# Patient Record
Sex: Female | Born: 1975
Health system: Southern US, Community
[De-identification: ages and names within clinical notes are randomized; demographics above are authoritative.]

## PROBLEM LIST (undated history)

## (undated) DIAGNOSIS — F419 Anxiety disorder, unspecified: Secondary | ICD-10-CM

## (undated) DIAGNOSIS — C4922 Malignant neoplasm of connective and soft tissue of left lower limb, including hip: Secondary | ICD-10-CM

## (undated) DIAGNOSIS — K219 Gastro-esophageal reflux disease without esophagitis: Secondary | ICD-10-CM

## (undated) DIAGNOSIS — C499 Malignant neoplasm of connective and soft tissue, unspecified: Secondary | ICD-10-CM

## (undated) DIAGNOSIS — T7840XA Allergy, unspecified, initial encounter: Secondary | ICD-10-CM

## (undated) HISTORY — DX: Anxiety disorder, unspecified: F41.9

## (undated) HISTORY — DX: Allergy, unspecified, initial encounter: T78.40XA

## (undated) HISTORY — PX: OTHER SURGICAL HISTORY: SHX169

## (undated) HISTORY — DX: Gastro-esophageal reflux disease without esophagitis: K21.9

---

## 2017-01-06 ENCOUNTER — Encounter: Payer: Self-pay | Admitting: Podiatry

## 2017-01-06 ENCOUNTER — Ambulatory Visit (INDEPENDENT_AMBULATORY_CARE_PROVIDER_SITE_OTHER): Payer: BLUE CROSS/BLUE SHIELD

## 2017-01-06 ENCOUNTER — Ambulatory Visit (INDEPENDENT_AMBULATORY_CARE_PROVIDER_SITE_OTHER): Payer: Self-pay | Admitting: Podiatry

## 2017-01-06 VITALS — BP 131/80 | HR 80

## 2017-01-06 DIAGNOSIS — M722 Plantar fascial fibromatosis: Secondary | ICD-10-CM | POA: Diagnosis not present

## 2017-01-06 MED ORDER — TRIAMCINOLONE ACETONIDE 10 MG/ML IJ SUSP
10.0000 mg | Freq: Once | INTRAMUSCULAR | Status: AC
  Administered 2017-01-06: 10 mg

## 2017-01-06 MED ORDER — PREDNISONE 10 MG PO TABS: ORAL_TABLET | ORAL | 0 refills | Status: DC

## 2017-01-06 NOTE — Progress Notes (Signed)
   Subjective:    Patient ID: Michelle Steele, female    DOB: Nov 07, 1975, 41 y.o.   MRN: [id number]  HPI    Review of Systems  All other systems reviewed and are negative.      Objective:   Physical Exam        Assessment & Plan:

## 2017-01-06 NOTE — Progress Notes (Signed)
Subjective:    Patient ID: Michelle Steele, female   DOB: 41 y.o.   MRN: [id number]   HPI patient states she's had plantar fascial pain for over one year with her right being worse than the left and intensification of pain gradually occurring over that time. Patient states she's had 2 injections orthotics made nothing is been helping her and the pain is becoming debilitating to the point where she cannot be active and she's gained 25 pounds. The right is now getting worse and is    Review of Systems  All other systems reviewed and are negative.       Objective:  Physical Exam  Constitutional: She is oriented to person, place, and time.  Cardiovascular: Intact distal pulses.   Musculoskeletal: Normal range of motion.  Neurological: She is alert and oriented to person, place, and time.  Skin: Skin is warm.  Nursing note and vitals reviewed.  neurovascular status intact muscle strength was adequate range of motion within normal limits with patient found to have intense discomfort plantar fascial left over right with inflammation fluid and pain almost to the point where it makes her cry. Patient has depression of the arch of a reasonable amount and has good range of motion of the first MPJ     Assessment:    Acute plantar fasciitis left over right of long-term duration which so far has failed to respond to conservative care     Plan:     H&P conditions reviewed at great length. At this point I do think the consideration is there for surgical intervention at this point and I discussed this versus aggressive conservative treatment in a try aggressive conservative care first and if symptoms do not settle down and it's can require most likely surgical intervention. I did go ahead and I explained all the different things she's had done I injected the plantar fascia today 3 mg Kenalog 5 mill grams Xylocaine and I applied air fracture walker placed on a 12 a sterile prep DS Dosepak and reappoint in 2  weeks to reevaluate  X-rays indicated large spur formation bilateral with no indications of stress fracture

## 2017-01-06 NOTE — Patient Instructions (Signed)

## 2017-01-14 ENCOUNTER — Telehealth: Payer: Self-pay | Admitting: *Deleted

## 2017-01-14 NOTE — Telephone Encounter (Signed)
"  I was in to see Dr. Paulla Dolly last week and he mentioned I might have to have surgery.  I just want to get an idea about how long it will be for me to get surgery schedule.  How far out is he?"  Dr. Mellody Drown schedule is pretty open.  He does surgery on Tuesdays and his next available is May 22, 29, etc.  "Okay I'm scheduled to see him next week.  My foot is doing better since he gave me the injection but my other foot is killing me so I don't know what to do.  I'll discuss it with him when I come in for my appointment."

## 2017-01-27 ENCOUNTER — Ambulatory Visit (INDEPENDENT_AMBULATORY_CARE_PROVIDER_SITE_OTHER): Payer: BLUE CROSS/BLUE SHIELD | Admitting: Podiatry

## 2017-01-27 ENCOUNTER — Encounter: Payer: Self-pay | Admitting: Podiatry

## 2017-01-27 DIAGNOSIS — M722 Plantar fascial fibromatosis: Secondary | ICD-10-CM | POA: Diagnosis not present

## 2017-01-27 MED ORDER — DICLOFENAC SODIUM 75 MG PO TBEC: 75.0000 mg | DELAYED_RELEASE_TABLET | Freq: Two times a day (BID) | ORAL | 1 refills | Status: DC

## 2017-01-27 MED ORDER — TRIAMCINOLONE ACETONIDE 10 MG/ML IJ SUSP
10.0000 mg | Freq: Once | INTRAMUSCULAR | Status: AC
  Administered 2017-01-27: 10 mg

## 2017-01-30 NOTE — Progress Notes (Signed)
Subjective:    Patient ID: Michelle Steele, female   DOB: 41 y.o.   MRN: [id number]   HPI patient states the left heel is feeling a lot better at this time and immobilization seemed to help but the right heel has been very sore    ROS      Objective:  Physical Exam Neurovascular status intact with improved left heel with exquisite discomfort plantar aspect right heel    Assessment:    Plantar fasciitis improving but right heel is quite sore when palpated     Plan:   Reviewed the possibility that the symptoms may recur and it still ultimately could require surgery but today I injected the right plantar fascia 3 mg Kenalog 5 mill grams Xylocaine and instructed on wearing the boot on the right we discussed possible modifications or change and orthotics that were made last

## 2017-02-17 ENCOUNTER — Ambulatory Visit: Payer: BLUE CROSS/BLUE SHIELD | Admitting: Podiatry

## 2017-02-19 ENCOUNTER — Encounter: Payer: Self-pay | Admitting: Podiatry

## 2017-02-19 ENCOUNTER — Ambulatory Visit (INDEPENDENT_AMBULATORY_CARE_PROVIDER_SITE_OTHER): Payer: BLUE CROSS/BLUE SHIELD | Admitting: Podiatry

## 2017-02-19 DIAGNOSIS — M722 Plantar fascial fibromatosis: Secondary | ICD-10-CM | POA: Diagnosis not present

## 2017-02-19 MED ORDER — TRIAMCINOLONE ACETONIDE 10 MG/ML IJ SUSP
10.0000 mg | Freq: Once | INTRAMUSCULAR | Status: AC
  Administered 2017-02-19: 10 mg

## 2017-02-22 NOTE — Progress Notes (Signed)
Subjective:    Patient ID: Michelle Steele, female   DOB: 41 y.o.   MRN: [id number]   HPI patient presents stating I'm doing better but I'm still getting pain in my heel if I been on it too long left    ROS      Objective:  Physical Exam neurovascular status intact with continued discomfort left plantar fashion     Assessment:   Plantar fasciitis secondary to foot structure with inflammation noted      Plan:  Advised on anti-inflammatories physical therapy and at this time reinjected the plantar fascial left 3 mg Kenalog 5 mill grams Xylocaine with no sequela and advised on reduced activity

## 2017-06-29 ENCOUNTER — Other Ambulatory Visit: Payer: Self-pay | Admitting: Podiatry

## 2020-06-27 ENCOUNTER — Ambulatory Visit: Payer: BLUE CROSS/BLUE SHIELD | Admitting: Orthopaedic Surgery

## 2020-07-04 ENCOUNTER — Ambulatory Visit (INDEPENDENT_AMBULATORY_CARE_PROVIDER_SITE_OTHER): Payer: BC Managed Care – PPO | Admitting: Orthopaedic Surgery

## 2020-07-04 ENCOUNTER — Ambulatory Visit: Payer: Self-pay

## 2020-07-04 ENCOUNTER — Encounter: Payer: Self-pay | Admitting: Orthopaedic Surgery

## 2020-07-04 VITALS — Ht 67.5 in | Wt 197.6 lb

## 2020-07-04 DIAGNOSIS — M79605 Pain in left leg: Secondary | ICD-10-CM | POA: Diagnosis not present

## 2020-07-04 DIAGNOSIS — R2242 Localized swelling, mass and lump, left lower limb: Secondary | ICD-10-CM | POA: Diagnosis not present

## 2020-07-04 NOTE — Progress Notes (Signed)
Office Visit Note   Patient: Michelle Steele           Date of Birth: 01-24-1976           MRN: [id number] Visit Date: 07/04/2020              Requested by: No referring provider defined for this encounter. PCP: Patient, No Pcp Per   Assessment & Plan: Visit Diagnoses:  1. Pain in left leg   2. Mass of left thigh     Plan: Given the large size of this mass of the left thigh, a MRI with contrast is necessary to get a better idea of what the mass looks like and what it involves.  I am concerned given it being such a large size.  She agrees with this treatment plan.  We will see her back after the MRI.  Follow-Up Instructions: Return in about 2 weeks (around 07/18/2020).   Orders:  Orders Placed This Encounter  Procedures  . XR FEMUR MIN 2 VIEWS LEFT   No orders of the defined types were placed in this encounter.     Procedures: No procedures performed   Clinical Data: No additional findings.   Subjective: Chief Complaint  Patient presents with  . Left Leg - Pain  The patient is a very pleasant 44 year old female who comes in for evaluation treatment of the left thigh mass.  This is been slowly growing over a few years now.  She first noticed it around 2019.  She says her left thigh is definitely larger than her right thigh.  It has been causing some pain when her daughter sits on her thigh.  She denies any type of pain that wakes her up at night.  She denies any hip or groin pain or knee pain on the left side.  She denies any numbness and tingling in her leg.  She denies any night pain or any previous injury.  HPI  Review of Systems She currently denies any headache, chest pain, shortness of breath, fever, chills, nausea, vomiting  Objective: Vital Signs: Ht 5' 7.5" (1.715 m)   Wt 197 lb 9.6 oz (89.6 kg)   BMI 30.49 kg/m   Physical Exam She is alert and oriented x3 and in no acute distress Ortho Exam On inspection, the left thigh is at least twice the size of  the right thigh.  There is a firm palpable mass of the soft tissues over a wide area of the anterior thigh.  There is no induration of the soft tissue and there is no redness.  Her hip and knee exam are normal.  She does not walk with a limp. Specialty Comments:  No specialty comments available.  Imaging: XR FEMUR MIN 2 VIEWS LEFT  Result Date: 07/04/2020 2 views of the left femur show normal-appearing cortical bone.  There is the outline of a very large soft tissue mass in the anterior thigh compartment.  This can be seen easily in the soft tissues on plain films surprisingly.    PMFS History: There are no problems to display for this patient.  History reviewed. No pertinent past medical history.  History reviewed. No pertinent family history.  History reviewed. No pertinent surgical history. Social History   Occupational History  . Not on file  Tobacco Use  . Smoking status: Unknown If Ever Smoked  . Smokeless tobacco: Never Used  Substance and Sexual Activity  . Alcohol use: No  . Drug use: No  . Sexual  activity: Not on file

## 2020-07-05 ENCOUNTER — Other Ambulatory Visit: Payer: Self-pay

## 2020-07-05 DIAGNOSIS — Q103 Other congenital malformations of eyelid: Secondary | ICD-10-CM

## 2020-07-05 DIAGNOSIS — R2242 Localized swelling, mass and lump, left lower limb: Secondary | ICD-10-CM

## 2020-07-05 DIAGNOSIS — F78A9 Other genetic related intellectual disability: Secondary | ICD-10-CM

## 2020-07-09 ENCOUNTER — Telehealth: Payer: Self-pay | Admitting: Orthopaedic Surgery

## 2020-07-09 NOTE — Telephone Encounter (Signed)
Pt will need to r/s for after MRI, lvm for pt to call back to r/s

## 2020-07-09 NOTE — Telephone Encounter (Signed)
Patient called advised her MRI is not until 07/30/2020 at 9:10am. Patient wanted Dr Ninfa Linden to be aware. The number to contact patient is [phone].  Patient is scheduled to see Dr Ninfa Linden 07/18/2020. Patient asked if she should reschedule her appointment with Dr Ninfa Linden?

## 2020-07-18 ENCOUNTER — Ambulatory Visit: Payer: BC Managed Care – PPO | Admitting: Orthopaedic Surgery

## 2020-07-24 ENCOUNTER — Encounter: Payer: Self-pay | Admitting: Orthopaedic Surgery

## 2020-07-30 ENCOUNTER — Other Ambulatory Visit: Payer: Self-pay | Admitting: Orthopaedic Surgery

## 2020-07-30 ENCOUNTER — Other Ambulatory Visit: Payer: Self-pay

## 2020-07-30 ENCOUNTER — Ambulatory Visit
Admission: RE | Admit: 2020-07-30 | Discharge: 2020-07-30 | Disposition: A | Discharge status: Home / Self Care | Payer: BC Managed Care – PPO | Source: Ambulatory Visit | Attending: Orthopaedic Surgery | Admitting: Orthopaedic Surgery

## 2020-07-30 DIAGNOSIS — R2242 Localized swelling, mass and lump, left lower limb: Secondary | ICD-10-CM

## 2020-07-30 MED ORDER — GADOBENATE DIMEGLUMINE 529 MG/ML IV SOLN
18.0000 mL | Freq: Once | INTRAVENOUS | Status: AC | PRN
  Administered 2020-07-30: 18 mL via INTRAVENOUS

## 2020-07-31 ENCOUNTER — Other Ambulatory Visit: Payer: Self-pay

## 2020-07-31 ENCOUNTER — Telehealth: Payer: Self-pay

## 2020-07-31 ENCOUNTER — Telehealth: Payer: Self-pay | Admitting: Orthopaedic Surgery

## 2020-07-31 DIAGNOSIS — M79652 Pain in left thigh: Secondary | ICD-10-CM

## 2020-07-31 NOTE — Telephone Encounter (Signed)
Patient called she stated Dr.Blackman just called her to schedule a appointment  for [facility] oncology she stated she would like to get a appointment scheduled today if possible [phone]

## 2020-07-31 NOTE — Telephone Encounter (Signed)
Christine with Radiology called wanting to make Dr.Blackman aware the report was ready in [place].  Christine CB# [phone]

## 2020-07-31 NOTE — Telephone Encounter (Signed)
Patient aware order sent urgently and they will call with appt for her

## 2020-08-07 ENCOUNTER — Telehealth: Payer: Self-pay

## 2020-08-07 NOTE — Telephone Encounter (Signed)
[facility] told patient they haven't received notes from [place], can we refax them? Fax [phone]

## 2020-08-08 ENCOUNTER — Telehealth: Payer: Self-pay | Admitting: Orthopaedic Surgery

## 2020-08-08 ENCOUNTER — Ambulatory Visit: Payer: BC Managed Care – PPO | Admitting: Orthopaedic Surgery

## 2020-08-08 NOTE — Telephone Encounter (Signed)
Received vm from patient. Dr. Ninfa Linden has referred her to another MD appt 12/6. and she needs all imaging to take with her. Please call when ready [phone]

## 2020-08-08 NOTE — Telephone Encounter (Signed)
Left voicemail advising patient Michelle Steele of x-rays was ready for pickup at front desk. Also informed patient that she would have to get a copy of her MRI from the location in which that study was performed.

## 2020-10-28 ENCOUNTER — Encounter: Payer: Self-pay | Admitting: Orthopaedic Surgery

## 2020-11-07 DIAGNOSIS — G8918 Other acute postprocedural pain: Secondary | ICD-10-CM | POA: Diagnosis not present

## 2020-11-07 DIAGNOSIS — K219 Gastro-esophageal reflux disease without esophagitis: Secondary | ICD-10-CM | POA: Diagnosis not present

## 2020-11-07 DIAGNOSIS — C4922 Malignant neoplasm of connective and soft tissue of left lower limb, including hip: Secondary | ICD-10-CM | POA: Diagnosis not present

## 2020-11-11 ENCOUNTER — Encounter: Payer: Self-pay | Admitting: *Deleted

## 2020-11-11 ENCOUNTER — Other Ambulatory Visit: Payer: Self-pay | Admitting: *Deleted

## 2020-11-11 DIAGNOSIS — K219 Gastro-esophageal reflux disease without esophagitis: Secondary | ICD-10-CM | POA: Insufficient documentation

## 2020-11-11 DIAGNOSIS — F411 Generalized anxiety disorder: Secondary | ICD-10-CM | POA: Insufficient documentation

## 2020-11-11 DIAGNOSIS — C4922 Malignant neoplasm of connective and soft tissue of left lower limb, including hip: Secondary | ICD-10-CM

## 2020-11-11 NOTE — Patient Outreach (Addendum)
[facility]) Care Management  11/11/2020  Michelle Steele [birth year] [id number]   Transition of care call/case closure   Referral received:11/05/20 Initial outreach:11/11/20 Insurance: [employer]    Subjective: Initial successful telephone call to patient's preferred number in order to complete transition of care assessment; 2 HIPAA identifiers verified. Explained purpose of call and completed transition of care assessment.  Michelle Steele states that she  is doing well, denies post-operative problems, says surgical incisions are unremarkable with dressing in place along with JP drain, She continues to manage drain with measuring output as recommended. She  states surgical pain well managed with prescribed medications, tolerating diet, denies bowel or bladder problems has taken Miralax and stool softeners as recommended.  Spouse is  assisting with her recovery. She reports tolerating mobility in the home with use of a walker.   Reviewed accessing the following [place] Benefits : She denies any ongoing health issues and says she does not need a referral to one of the [place] chronic disease management programs.  [place] not have the hospital indemnity She does not use  a [facility] outpatient pharmacy.  Provider Pernella with contact information of [place].find a doctor and contact number.  Objective:  Michelle Steele was hospitalized at [facility] 3/3-11/08/20 for Liposarcoma of left lower extremity , radial resection of soft tissue tumor. Comorbidities include:  She was discharged to home on 11/08/20  without the need for home health services, and with rolling walker.    Assessment:  Patient voices good understanding of all discharge instructions.  See transition of care flowsheet for assessment details.   Plan:  Reviewed hospital discharge diagnosis of Resection of soft tissue tumor left thigh  and discharge treatment plan using hospital discharge  instructions, assessing medication adherence, reviewing problems requiring provider notification, and discussing the importance of follow up with surgeon, primary care provider and/or specialists as directed.  Reviewed  healthy lifestyle program information to receive discounted premium for  2023   Step 1: Get  your annual physical  Step 2: Complete your health assessment  Step 3:Identify your current health status and complete the corresponding action step between September 07, 2020 and May 08, 2021.    No ongoing care management needs identified so will close case to [place] Management services and route successful outreach letter with [place] Management pamphlet and 24 Hour Nurse Line Magnet to [place] Management clinical pool to be mailed to patient's home address.     Joylene Draft, RN, BSN  [place] Management Coordinator  [phone]- Mobile [phone]- Toll Free Main Office

## 2020-11-18 DIAGNOSIS — C4922 Malignant neoplasm of connective and soft tissue of left lower limb, including hip: Secondary | ICD-10-CM | POA: Diagnosis not present

## 2020-11-26 DIAGNOSIS — I10 Essential (primary) hypertension: Secondary | ICD-10-CM | POA: Diagnosis not present

## 2020-11-26 DIAGNOSIS — Z79899 Other long term (current) drug therapy: Secondary | ICD-10-CM | POA: Diagnosis not present

## 2020-11-26 DIAGNOSIS — N3941 Urge incontinence: Secondary | ICD-10-CM | POA: Diagnosis not present

## 2020-11-26 DIAGNOSIS — F329 Major depressive disorder, single episode, unspecified: Secondary | ICD-10-CM | POA: Diagnosis not present

## 2020-11-26 DIAGNOSIS — K5900 Constipation, unspecified: Secondary | ICD-10-CM | POA: Diagnosis not present

## 2020-11-26 DIAGNOSIS — M5432 Sciatica, left side: Secondary | ICD-10-CM | POA: Diagnosis not present

## 2020-11-26 DIAGNOSIS — G43909 Migraine, unspecified, not intractable, without status migrainosus: Secondary | ICD-10-CM | POA: Diagnosis not present

## 2020-11-26 DIAGNOSIS — F419 Anxiety disorder, unspecified: Secondary | ICD-10-CM | POA: Diagnosis not present

## 2020-11-26 DIAGNOSIS — K219 Gastro-esophageal reflux disease without esophagitis: Secondary | ICD-10-CM | POA: Diagnosis not present

## 2020-11-26 DIAGNOSIS — Z13220 Encounter for screening for lipoid disorders: Secondary | ICD-10-CM | POA: Diagnosis not present

## 2020-11-26 DIAGNOSIS — F410 Panic disorder [episodic paroxysmal anxiety] without agoraphobia: Secondary | ICD-10-CM | POA: Diagnosis not present

## 2020-12-03 DIAGNOSIS — Z483 Aftercare following surgery for neoplasm: Secondary | ICD-10-CM | POA: Diagnosis not present

## 2020-12-03 DIAGNOSIS — Z9889 Other specified postprocedural states: Secondary | ICD-10-CM | POA: Diagnosis not present

## 2020-12-03 DIAGNOSIS — C499 Malignant neoplasm of connective and soft tissue, unspecified: Secondary | ICD-10-CM | POA: Diagnosis not present

## 2020-12-16 ENCOUNTER — Ambulatory Visit (INDEPENDENT_AMBULATORY_CARE_PROVIDER_SITE_OTHER): Payer: 59

## 2020-12-16 ENCOUNTER — Encounter: Payer: Self-pay | Admitting: Orthopaedic Surgery

## 2020-12-16 ENCOUNTER — Ambulatory Visit (INDEPENDENT_AMBULATORY_CARE_PROVIDER_SITE_OTHER): Payer: 59 | Admitting: Orthopaedic Surgery

## 2020-12-16 VITALS — Ht 67.0 in | Wt 193.0 lb

## 2020-12-16 DIAGNOSIS — M545 Low back pain, unspecified: Secondary | ICD-10-CM

## 2020-12-16 DIAGNOSIS — G8929 Other chronic pain: Secondary | ICD-10-CM

## 2020-12-16 NOTE — Progress Notes (Signed)
Office Visit Note   Patient: Michelle Steele           Date of Birth: [birth year]           MRN: [id number] Visit Date: 12/16/2020              Requested by: No referring provider defined for this encounter. PCP: Patient, No Pcp Per (Inactive)   Assessment & Plan: Visit Diagnoses:  1. Chronic bilateral low back pain, unspecified whether sciatica present     Plan: I would like to send her to physical therapy for her lumbar spine for any modalities that can help with low back pain.  I do feel it is essential to obtain an MRI of her lumbar spine without contrast given her recent sarcoma diagnosis.  The main reason of the MRI is to evaluate for degenerative disc disease due to the L5-S1 narrowing and due to the pain and to assess the facet joints of the lower lumbar spine.  She agrees with this treatment plan.  We will work on setting up outpatient therapy and the MRI as well.  We will see her back once we have the MRI results.  Follow-Up Instructions: No follow-ups on file.   Orders:  Orders Placed This Encounter  Procedures  . XR Lumbar Spine 2-3 Views   No orders of the defined types were placed in this encounter.     Procedures: No procedures performed   Clinical Data: No additional findings.   Subjective: Chief Complaint  Patient presents with  . Lower Back - Pain  . Left Hip - Pain  . Right Hip - Pain  The patient is a 45 year old female well-known to me.  Just a month ago she underwent excision of a sarcoma from her left thigh.  This was done [facility] at [facility].  She comes to me today for evaluation treatment of severe low back pain that radiates across her lumbar spine.  There is no radicular component of this but it does wake her up at night.  There is no numbness and tingling going down her legs or weakness.  She is recovering well from surgery on her sarcoma.  She is not needing to have a radiation treatment or chemotherapy according the  patient.  She has been dealing with this back pain for a long period time it does wake her up at night and she just cannot get comfortable.  She works within the [employer].  She is not diabetic.  She is not overweight.  HPI  Review of Systems She currently denies any headache, chest pain, shortness of breath, fever, chills, nausea, vomiting  Objective: Vital Signs: Ht 5\' 7"  (1.702 m)   Wt 193 lb (87.5 kg)   BMI 30.23 kg/m   Physical Exam She is alert and orient x3 and in no acute distress Ortho Exam Examination of her low back does show pain with flexion extension of the lower aspect of her lumbar spine.  She is in the paraspinal muscles quite a bit. Specialty Comments:  No specialty comments available.  Imaging: XR Lumbar Spine 2-3 Views  Result Date: 12/16/2020 2 views of the lumbar spine show significant the space narrowing between L5 and S1.    PMFS History: Patient Active Problem List   Diagnosis Date Noted  . Generalized anxiety disorder 11/11/2020  . GERD (gastroesophageal reflux disease) 11/11/2020  . Liposarcoma of thigh, left (HCC)Resection  11/11/2020   Past Medical History:  Diagnosis Date  .  Allergy   . Anxiety   . GERD (gastroesophageal reflux disease)     History reviewed. No pertinent family history.  Past Surgical History:  Procedure Laterality Date  . resection of soft tissue tumor left thigh Left    Social History   Occupational History  . Not on file  Tobacco Use  . Smoking status: Unknown If Ever Smoked  . Smokeless tobacco: Never Used  Substance and Sexual Activity  . Alcohol use: No  . Drug use: No  . Sexual activity: Not on file

## 2020-12-16 NOTE — Addendum Note (Signed)
Addended by: Lendon Collar on: 12/16/2020 04:22 PM   Modules accepted: Orders

## 2021-01-02 ENCOUNTER — Ambulatory Visit (INDEPENDENT_AMBULATORY_CARE_PROVIDER_SITE_OTHER): Payer: 59 | Admitting: Physical Therapy

## 2021-01-02 ENCOUNTER — Encounter: Payer: Self-pay | Admitting: Physical Therapy

## 2021-01-02 ENCOUNTER — Other Ambulatory Visit: Payer: Self-pay

## 2021-01-02 DIAGNOSIS — R29898 Other symptoms and signs involving the musculoskeletal system: Secondary | ICD-10-CM | POA: Diagnosis not present

## 2021-01-02 DIAGNOSIS — G8929 Other chronic pain: Secondary | ICD-10-CM

## 2021-01-02 DIAGNOSIS — M545 Low back pain, unspecified: Secondary | ICD-10-CM

## 2021-01-02 NOTE — Patient Instructions (Signed)
Access Code: [email or web address] URL: [email or web address] Date: 01/02/2021 Prepared by: Faustino Congress  Exercises Supine Piriformis Stretch with Foot on Ground - 2 x daily - 7 x weekly - 3 reps - 1 sets - 30 sec hold Standing Quadratus Lumborum Mobilization with Small Ball on Wall - 2 x daily - 7 x weekly - 1 sets - 1-2 reps - 3-5 min hold  Patient Education Trigger Point Dry Needling

## 2021-01-02 NOTE — Therapy (Signed)
[facility] [address] [place], [place], [address] Phone: [phone]   Fax:  [phone]  Physical Therapy Evaluation  Patient Details  Name: Michelle Steele MRN: [id number] Date of Birth: 1975/10/24 Referring Provider (PT): Mcarthur Rossetti, MD   Encounter Date: 01/02/2021   PT End of Session - 01/02/21 1422    Visit Number 1    Number of Visits 12    Date for PT Re-Evaluation 02/13/21    Authorization Type [facility]    PT Start Time 1340    PT Stop Time 1419    PT Time Calculation (min) 39 min    Activity Tolerance Patient tolerated treatment well    Behavior During Therapy [facility] for tasks assessed/performed           Past Medical History:  Diagnosis Date  . Allergy   . Anxiety   . GERD (gastroesophageal reflux disease)     Past Surgical History:  Procedure Laterality Date  . resection of soft tissue tumor left thigh Left     There were no vitals filed for this visit.    Subjective Assessment - 01/02/21 1342    Subjective Pt is a 45 y/o female who presents to OPPT for acute exacerbation of LBP.  She reports intermittent episodes of pain and no significant improvement with trial of exercises.  She had some hip and back pain with pregnancy as well, with hx of sciatic nerve pain.  She reports since 2020 pain is aggravated by light ADLs.  Recent surgery for Lt thigh liopsarcoma which altered gait prior to surgery.    Pertinent History anxiety, recent removal of liposarcoma    Limitations Standing;Walking;Lifting    Diagnostic tests MRI ordered    Patient Stated Goals improve pain, clean house without significant pain    Currently in Pain? Yes    Pain Score 3    up to 6/10; at best 3/10   Pain Location Back    Pain Orientation Lower;Medial;Left;Right    Pain Descriptors / Indicators Aching;Sharp    Pain Type Acute pain;Chronic pain    Pain Onset More than a month ago    Pain Frequency Intermittent    Aggravating Factors   household cleaning, when irritated all activities are uncomfortable    Pain Relieving Factors medication              [facility] PT Assessment - 01/02/21 1339      Assessment   Medical Diagnosis M54.50,G89.29 (ICD-10-CM) - Chronic bilateral low back pain, unspecified whether sciatica present    Referring Provider (PT) Mcarthur Rossetti, MD    Onset Date/Surgical Date --   chronic   Hand Dominance Left;Right    Next MD Visit after MRI - not scheduled    Prior Therapy none for this condition      Precautions   Precautions Other (comment)    Precaution Comments recent resection of liposarcoma      Restrictions   Weight Bearing Restrictions No      Balance Screen   Has the patient fallen in the past 6 months No    Has the patient had a decrease in activity level because of a fear of falling?  No    Is the patient reluctant to leave their home because of a fear of falling?  No      Home Environment   Living Environment Private residence    Living Arrangements Spouse/significant other;Children   48 and 51 y/o children  Type of Home House    Additional Comments no significant difficulty with stairs (occasional knee pain)      Prior Function   Level of Independence Independent    Vocation Full time employment    Vocation Requirements eye nurse - walking, sitting, standing, occasional pushing w/c    Leisure tries to walk 10,000 steps, has new puppy, spend time with children      Cognition   Overall Cognitive Status Within Functional Limits for tasks assessed      Observation/Other Assessments   Focus on Therapeutic Outcomes (FOTO)  61 (predicted 71)      Posture/Postural Control   Posture/Postural Control Postural limitations    Postural Limitations Rounded Shoulders;Forward head      ROM / Strength   AROM / PROM / Strength AROM;Strength      AROM   AROM Assessment Site Lumbar    Lumbar Flexion WNL with pain    Lumbar Extension WNL with pain    Lumbar - Right Side  Bend WNL    Lumbar - Left Side Bend WNL    Lumbar - Right Rotation WNL    Lumbar - Left Rotation WNL      Strength   Overall Strength Comments bil hip/knee strength 5/5; core strength grossly 3+/5    Strength Assessment Site Hip;Knee      Flexibility   Soft Tissue Assessment /Muscle Length yes    Hamstrings mild tightness bil    Piriformis tightness bil    Quadratus Lumborum Lt side trigger points noted      Palpation   Spinal mobility pain with lower thoracic CPA mobs in Lt QL, otherwise spinal mobility WNL    Palpation comment trigger points noted in bil glutes and QL      Special Tests    Special Tests Lumbar    Lumbar Tests Slump Test      Slump test   Findings Negative                      Objective measurements completed on examination: See above findings.       [facility] Adult PT Treatment/Exercise - 01/02/21 1339      Exercises   Exercises Other Exercises    Other Exercises  demonstrated piriformis stretch and self mobilization with tennis ball to QL for home, encouraged continued walking and beachbody program for generalized exercise      Manual Therapy   Manual therapy comments STM and compression to Lt QL            Trigger Point Dry Needling - 01/02/21 1422    Consent Given? Yes    Education Handout Provided Yes    Muscles Treated Back/Hip Quadratus lumborum    Quadratus Lumborum Response Twitch response elicited                PT Education - 01/02/21 1434    Education Details HEP, DN    Person(s) Educated Patient    Methods Explanation;Demonstration;Handout    Comprehension Verbalized understanding;Returned demonstration;Need further instruction            PT Short Term Goals - 01/02/21 1454      PT SHORT TERM GOAL #1   Title independent with initial HEP    Time 3    Period Weeks    Status New    Target Date 01/23/21             PT Long Term Goals - 01/02/21  [place] #1   Title independent  with final HEP    Time 6    Period Weeks    Status New    Target Date 02/13/21      PT LONG TERM GOAL #2   Title FOTO score improved to 71 for improved function    Time 6    Period Weeks    Status New    Target Date 02/13/21      PT LONG TERM GOAL #3   Title perform lumbar flexion without increase in pain for improved function    Time 6    Period Weeks    Status New    Target Date 02/13/21      PT LONG TERM GOAL #4   Title report pain < 4/10 with household activiites for improved function    Time 6    Period Weeks    Status New    Target Date 02/13/21                  Plan - 01/02/21 1423    Clinical Impression Statement Pt is a 45 y/o female who presents to OPPT for chronic LBP with increased exacerbation over the past 2 years.  She demonstrates decreased flexibility, active trigger points and decreased core strength affecting functional mobility. Pt will benefit from PT to address deficits listed.    Personal Factors and Comorbidities Comorbidity 2;Time since onset of injury/illness/exacerbation    Comorbidities anxiety, recent removal of liposarcoma    Examination-Activity Limitations Bed Mobility;Sleep;Bend;Squat;Stairs;Stand;Lift;Transfers    Examination-Participation Restrictions Community Activity;Shop;Occupation    Stability/Clinical Decision Making Evolving/Moderate complexity    Clinical Decision Making Moderate    PT Frequency 2x / week   1-2x/wk   PT Duration 6 weeks    PT Treatment/Interventions ADLs/Self Care Home Management;Cryotherapy;Moist Heat;Therapeutic exercise;Therapeutic activities;Functional mobility training;Ultrasound;Patient/family education;Manual techniques;Taping;Dry needling;Passive range of motion;Neuromuscular re-education    PT Next Visit Plan review HEP and assess response to DN, manual/modalities PRN (no estim due to recent sarcoma), core strengthning    PT Home Exercise Plan Access Code: [email or web address]    [email or web address] and Agree with Plan  of Care Patient           Patient will benefit from skilled therapeutic intervention in order to improve the following deficits and impairments:  Pain,Decreased strength,Impaired flexibility,Decreased mobility,Increased fascial restricitons,Increased muscle spasms  Visit Diagnosis: Chronic bilateral low back pain without sciatica - Plan: PT plan of care cert/re-cert  Other symptoms and signs involving the musculoskeletal system - Plan: PT plan of care cert/re-cert     Problem List Patient Active Problem List   Diagnosis Date Noted  . Generalized anxiety disorder 11/11/2020  . GERD (gastroesophageal reflux disease) 11/11/2020  . Liposarcoma of thigh, left (HCC)Resection  11/11/2020      Laureen Abrahams, PT, DPT 01/02/21 2:59 PM     [facility] [address] [place], [place], [address] Phone: [phone]   Fax:  [phone]  Name: Francine Hannan MRN: [id number] Date of Birth: September 23, 1975

## 2021-01-08 ENCOUNTER — Other Ambulatory Visit: Payer: Self-pay

## 2021-01-08 ENCOUNTER — Ambulatory Visit (INDEPENDENT_AMBULATORY_CARE_PROVIDER_SITE_OTHER): Payer: 59 | Admitting: Physical Therapy

## 2021-01-08 ENCOUNTER — Encounter: Payer: Self-pay | Admitting: Physical Therapy

## 2021-01-08 DIAGNOSIS — G8929 Other chronic pain: Secondary | ICD-10-CM

## 2021-01-08 DIAGNOSIS — M545 Low back pain, unspecified: Secondary | ICD-10-CM | POA: Diagnosis not present

## 2021-01-08 DIAGNOSIS — R29898 Other symptoms and signs involving the musculoskeletal system: Secondary | ICD-10-CM

## 2021-01-08 NOTE — Therapy (Signed)
[facility] Therapy [address] [place], [place], [address] Phone: [phone]   Fax:  [phone]  Physical Therapy Treatment  Patient Details  Name: Michelle Steele MRN: [id number] Date of Birth: May 13, 1976 Referring Provider (PT): Mcarthur Rossetti, MD   Encounter Date: 01/08/2021   PT End of Session - 01/08/21 1151    Visit Number 2    Number of Visits 12    Date for PT Re-Evaluation 02/13/21    Authorization Type [facility]    PT Start Time 1058    PT Stop Time 1136    PT Time Calculation (min) 38 min    Activity Tolerance Patient tolerated treatment well    Behavior During Therapy [facility] for tasks assessed/performed           Past Medical History:  Diagnosis Date  . Allergy   . Anxiety   . GERD (gastroesophageal reflux disease)     Past Surgical History:  Procedure Laterality Date  . resection of soft tissue tumor left thigh Left     There were no vitals filed for this visit.   Subjective Assessment - 01/08/21 1059    Subjective pain is worse with driving (40 min one way to work, and standing still is worse); sore from DN after last visit, and pain has increased over the past few days.  had to take a tramadol yesterday.    Pertinent History anxiety, recent removal of liposarcoma    Limitations Standing;Walking;Lifting    Diagnostic tests MRI ordered    Patient Stated Goals improve pain, clean house without significant pain    Currently in Pain? Yes    Pain Score 6     Pain Location Back    Pain Orientation Lower;Medial;Left;Right    Pain Descriptors / Indicators Aching;Sharp    Pain Type Acute pain;Chronic pain    Pain Onset More than a month ago    Pain Frequency Intermittent    Aggravating Factors  household cleaning, standing sitt, driving/extended sitting    Pain Relieving Factors medication                             [facility] Adult PT Treatment/Exercise - 01/08/21 1103      Exercises   Exercises  Lumbar      Lumbar Exercises: Stretches   Standing Extension 10 reps;10 seconds    Prone on Elbows Stretch 60 seconds;3 reps   continuous   Press Ups 10 reps;10 seconds      Lumbar Exercises: Aerobic   Nustep L7 x 6 min      Lumbar Exercises: Machines for Strengthening   Other Lumbar Machine Exercise rows x10 reps; 20#      Lumbar Exercises: Standing   Functional Squats 10 reps    Other Standing Lumbar Exercises deadlifts x10 reps; 5# KB                  PT Education - 01/08/21 1151    Education Details added to HEP    Person(s) Educated Patient    Methods Explanation;Demonstration;Handout    Comprehension Verbalized understanding;Returned demonstration;Need further instruction            PT Short Term Goals - 01/02/21 1454      PT SHORT TERM GOAL #1   Title independent with initial HEP    Time 3    Period Weeks    Status New    Target Date 01/23/21  PT Long Term Goals - 01/02/21 1454      PT LONG TERM GOAL #1   Title independent with final HEP    Time 6    Period Weeks    Status New    Target Date 02/13/21      PT LONG TERM GOAL #2   Title FOTO score improved to 71 for improved function    Time 6    Period Weeks    Status New    Target Date 02/13/21      PT LONG TERM GOAL #3   Title perform lumbar flexion without increase in pain for improved function    Time 6    Period Weeks    Status New    Target Date 02/13/21      PT LONG TERM GOAL #4   Title report pain < 4/10 with household activiites for improved function    Time 6    Period Weeks    Status New    Target Date 02/13/21                 Plan - 01/08/21 1152    Clinical Impression Statement Pt with positive initial response to extension based exercises with core stability included reporting decreased symptoms with transitional movements at end of session.  Added these exercises to HEP today and will continue to benefit from PT to maximize function.    Personal  Factors and Comorbidities Comorbidity 2;Time since onset of injury/illness/exacerbation    Comorbidities anxiety, recent removal of liposarcoma    Examination-Activity Limitations Bed Mobility;Sleep;Bend;Squat;Stairs;Stand;Lift;Transfers    Examination-Participation Restrictions Community Activity;Shop;Occupation    Stability/Clinical Decision Making Evolving/Moderate complexity    PT Frequency 2x / week   1-2x/wk   PT Duration 6 weeks    PT Treatment/Interventions ADLs/Self Care Home Management;Cryotherapy;Moist Heat;Therapeutic exercise;Therapeutic activities;Functional mobility training;Ultrasound;Patient/family education;Manual techniques;Taping;Dry needling;Passive range of motion;Neuromuscular re-education    PT Next Visit Plan review HEP and assess response to DN, manual/modalities PRN (no estim due to recent sarcoma), core strengthning    PT Home Exercise Plan Access Code: [email or web address]    [email or web address] and Agree with Plan of Care Patient           Patient will benefit from skilled therapeutic intervention in order to improve the following deficits and impairments:  Pain,Decreased strength,Impaired flexibility,Decreased mobility,Increased fascial restricitons,Increased muscle spasms  Visit Diagnosis: Chronic bilateral low back pain without sciatica  Other symptoms and signs involving the musculoskeletal system     Problem List Patient Active Problem List   Diagnosis Date Noted  . Generalized anxiety disorder 11/11/2020  . GERD (gastroesophageal reflux disease) 11/11/2020  . Liposarcoma of thigh, left (HCC)Resection  11/11/2020      Laureen Abrahams, PT, DPT 01/08/21 11:53 AM   [facility] Physical Therapy [address] [place], [place], [address] Phone: [phone]   Fax:  [phone]  Name: Michelle Steele MRN: [id number] Date of Birth: 1975/12/15

## 2021-01-08 NOTE — Patient Instructions (Signed)
Access Code: [email or web address] URL: [email or web address] Date: 01/08/2021 Prepared by: Faustino Congress  Program Notes Healthy and Quick & Everything Fit  Healthy and Quick & Everything Fit ([email or web address]/)   Exercises Supine Piriformis Stretch with Foot on Ground - 2 x daily - 7 x weekly - 3 reps - 1 sets - 30 sec hold Standing Quadratus Lumborum Mobilization with Small Ball on Wall - 2 x daily - 7 x weekly - 1 sets - 1-2 reps - 3-5 min hold Supine Posterior Pelvic Tilt with Pelvic Floor Contraction - 2 x daily - 7 x weekly - 1 sets - 10 reps - 5-10 sec hold Supine Bilateral Isometric Hip Flexion - 2 x daily - 7 x weekly - 3 sets - 10 reps Prone on Elbows Stretch - 3-4 x daily - 7 x weekly - 1 sets - 1 reps - 3 min hold Prone Press Up - 3-4 x daily - 7 x weekly - 1 sets - 10 reps - 10 sec hold Standing Lumbar Extension - 3-4 x daily - 7 x weekly - 3 sets - 10 reps - 10 sec hold  Patient Education Trigger Point Dry Needling

## 2021-01-09 ENCOUNTER — Ambulatory Visit
Admission: RE | Admit: 2021-01-09 | Discharge: 2021-01-09 | Disposition: A | Discharge status: Home / Self Care | Payer: 59 | Source: Ambulatory Visit | Attending: Orthopaedic Surgery | Admitting: Orthopaedic Surgery

## 2021-01-09 DIAGNOSIS — M5136 Other intervertebral disc degeneration, lumbar region: Secondary | ICD-10-CM | POA: Diagnosis not present

## 2021-01-09 DIAGNOSIS — G8929 Other chronic pain: Secondary | ICD-10-CM

## 2021-01-09 DIAGNOSIS — M545 Low back pain, unspecified: Secondary | ICD-10-CM

## 2021-01-09 DIAGNOSIS — M5126 Other intervertebral disc displacement, lumbar region: Secondary | ICD-10-CM | POA: Diagnosis not present

## 2021-01-14 ENCOUNTER — Encounter: Payer: Self-pay | Admitting: Orthopaedic Surgery

## 2021-01-16 ENCOUNTER — Encounter: Payer: 59 | Admitting: Rehabilitative and Restorative Service Providers"

## 2021-01-17 ENCOUNTER — Encounter: Payer: Self-pay | Admitting: Orthopaedic Surgery

## 2021-01-20 ENCOUNTER — Telehealth: Payer: Self-pay | Admitting: Physical Medicine and Rehabilitation

## 2021-01-20 DIAGNOSIS — G8929 Other chronic pain: Secondary | ICD-10-CM

## 2021-01-20 NOTE — Telephone Encounter (Signed)
I don't see referral to Dr. Ernestina Patches or recent notes mentioning injections. Please advise.

## 2021-01-20 NOTE — Telephone Encounter (Signed)
Ok to order injection with dr. Ernestina Patches?

## 2021-01-20 NOTE — Telephone Encounter (Signed)
Patient called for an update about per Dr. Ninfa Linden to set an appt for back injections. Please call patient at [phone].

## 2021-01-20 NOTE — Telephone Encounter (Signed)
Yes.  I spoke to her in length about it.  Needs ESI Left L4 vs L5

## 2021-01-21 NOTE — Telephone Encounter (Signed)
Sorry.Marland KitchenMarland KitchenReferral in now

## 2021-01-23 ENCOUNTER — Other Ambulatory Visit: Payer: Self-pay

## 2021-01-23 ENCOUNTER — Ambulatory Visit (INDEPENDENT_AMBULATORY_CARE_PROVIDER_SITE_OTHER): Payer: 59 | Admitting: Rehabilitative and Restorative Service Providers"

## 2021-01-23 ENCOUNTER — Encounter: Payer: Self-pay | Admitting: Rehabilitative and Restorative Service Providers"

## 2021-01-23 DIAGNOSIS — G8929 Other chronic pain: Secondary | ICD-10-CM | POA: Diagnosis not present

## 2021-01-23 DIAGNOSIS — R29898 Other symptoms and signs involving the musculoskeletal system: Secondary | ICD-10-CM

## 2021-01-23 DIAGNOSIS — M545 Low back pain, unspecified: Secondary | ICD-10-CM | POA: Diagnosis not present

## 2021-01-23 NOTE — Patient Instructions (Signed)
Access Code: [email or web address] URL: [email or web address] Date: 01/23/2021 Prepared by: Scot Jun  Program Notes Healthy and Quick & Everything Fit  Healthy and Quick & Everything Fit ([email or web address]/)   Exercises Supine Piriformis Stretch with Foot on Ground - 2 x daily - 7 x weekly - 3 reps - 1 sets - 30 sec hold Supine Posterior Pelvic Tilt with Pelvic Floor Contraction - 2 x daily - 7 x weekly - 1 sets - 10 reps - 5-10 sec hold Supine Bilateral Isometric Hip Flexion - 2 x daily - 7 x weekly - 3 sets - 10 reps Prone Press Up - 3-4 x daily - 7 x weekly - 1 sets - 10 reps - 10 sec hold Standing Lumbar Extension - 3-4 x daily - 7 x weekly - 3 sets - 10 reps - 10 sec hold Bird Dog - 2 x daily - 7 x weekly - 1 sets - 15 reps - 3 hold Plank on Knees - 1 x daily - 7 x weekly - 1 sets - 5 reps - to fatigue hold Supine Dead Bug with Leg Extension - 1 x daily - 7 x weekly - 3 sets - 10 reps  Patient Education Trigger Point Dry Needling

## 2021-01-23 NOTE — Therapy (Signed)
[facility] [address] [place], [place], [address] Phone: [phone]   Fax:  [phone]  Physical Therapy Treatment  Patient Details  Name: Michelle Steele MRN: [id number] Date of Birth: 1976/01/10 Referring Provider (PT): Mcarthur Rossetti, MD   Encounter Date: 01/23/2021   PT End of Session - 01/23/21 1550    Visit Number 3    Number of Visits 12    Date for PT Re-Evaluation 02/13/21    Authorization Type [facility]    PT Start Time 9528    PT Stop Time 1634    PT Time Calculation (min) 44 min    Activity Tolerance Patient tolerated treatment well    Behavior During Therapy [facility] for tasks assessed/performed           Past Medical History:  Diagnosis Date  . Allergy   . Anxiety   . GERD (gastroesophageal reflux disease)     Past Surgical History:  Procedure Laterality Date  . resection of soft tissue tumor left thigh Left     There were no vitals filed for this visit.   Subjective Assessment - 01/23/21 1553    Subjective Pt. stated having a really good week with reduced complaints to around 3/10 or so.  Hasn't taken medicine since sunday.  Pt. indicated injection in back to be done (waiting for appointment).    Pertinent History anxiety, recent removal of liposarcoma    Limitations Standing;Walking;Lifting    Diagnostic tests MRI ordered    Patient Stated Goals improve pain, clean house without significant pain    Currently in Pain? Yes    Pain Score 3     Pain Location Back    Pain Orientation Lower    Pain Descriptors / Indicators Aching;Sharp    Pain Onset More than a month ago    Pain Frequency Intermittent    Aggravating Factors  increased activity for work  leads to pain c lying down, standing still, sitting still    Pain Relieving Factors medicine, some exercise stuff              [facility] PT Assessment - 01/23/21 0001      Assessment   Medical Diagnosis M54.50,G89.29 (ICD-10-CM) - Chronic bilateral low  back pain, unspecified whether sciatica present    Referring Provider (PT) Mcarthur Rossetti, MD      AROM   Lumbar Flexion movement to ankles, no complaints worsened, some mild relief.    Lumbar Extension 100% WFL c discomfort on center/Lt lumbar.  Repeated extension x 5 in standing with similar complaints.    Lumbar - Right Side Bend WNL no complaints    Lumbar - Left Side Bend WNL no complaints      Strength   Overall Strength Comments lumbar flexion 4/5                         [facility] Adult PT Treatment/Exercise - 01/23/21 0001      Lumbar Exercises: Stretches   Standing Extension 10 reps    Other Lumbar Stretch Exercise Rt sidelying regional rotation stretch 15 sec x 3      Lumbar Exercises: Supine   Other Supine Lumbar Exercises supine dying bug arm/leg extension x 10 bilateral      Lumbar Exercises: Prone   Other Prone Lumbar Exercises on knees to fatigue x 3      Manual Therapy   Manual therapy comments Rt sidelying regional rotation mobilization g3, compression  to Lt inferior lumbar paraspinals.            Trigger Point Dry Needling - 01/23/21 0001    Consent Given? Yes    Education Handout Provided Yes    Dry Needling Comments Lt lumbar inferior paraspinals                  PT Short Term Goals - 01/23/21 1630      PT SHORT TERM GOAL #1   Title independent with initial HEP    Time 3    Period Weeks    Status Achieved    Target Date 01/23/21             PT Long Term Goals - 01/23/21 1630      PT LONG TERM GOAL #1   Title independent with final HEP    Time 6    Period Weeks    Status On-going    Target Date 02/13/21      PT LONG TERM GOAL #2   Title FOTO score improved to 71 for improved function    Time 6    Period Weeks    Status On-going    Target Date 02/13/21      PT LONG TERM GOAL #3   Title perform lumbar flexion without increase in pain for improved function    Time 6    Period Weeks    Status Achieved       PT LONG TERM GOAL #4   Title report pain < 4/10 with household activiites for improved function    Time 6    Period Weeks    Status On-going    Target Date 02/13/21                 Plan - 01/23/21 1631    Clinical Impression Statement Reassessment today showed normal slight hypermobility in lumbar passive accessory movements, pain in Lt inferior lumbar paraspinals and lumbar flexion weakness/core stability weakness.  Dry needling today provided immediate reduction in Lt lumbar symptoms c bed mobility and lumbar extension moblity today.  Pt. to benefit from continued skilled PT services for improved myofascial complaints, improved core stability and coordination.    Personal Factors and Comorbidities Comorbidity 2;Time since onset of injury/illness/exacerbation    Comorbidities anxiety, recent removal of liposarcoma    Examination-Activity Limitations Bed Mobility;Sleep;Bend;Squat;Stairs;Stand;Lift;Transfers    Examination-Participation Restrictions Community Activity;Shop;Occupation    Stability/Clinical Decision Making Evolving/Moderate complexity    PT Frequency 2x / week   1-2x/wk   PT Duration 6 weeks    PT Treatment/Interventions ADLs/Self Care Home Management;Cryotherapy;Moist Heat;Therapeutic exercise;Therapeutic activities;Functional mobility training;Ultrasound;Patient/family education;Manual techniques;Taping;Dry needling;Passive range of motion;Neuromuscular re-education    PT Next Visit Plan assess response to DN, manual/modalities PRN (no estim due to recent sarcoma), core strengthning    PT Home Exercise Plan Access Code: [email or web address]    Consulted and Agree with Plan of Care Patient           Patient will benefit from skilled therapeutic intervention in order to improve the following deficits and impairments:  Pain,Decreased strength,Impaired flexibility,Decreased mobility,Increased fascial restricitons,Increased muscle spasms  Visit Diagnosis: Chronic bilateral  low back pain without sciatica  Other symptoms and signs involving the musculoskeletal system     Problem List Patient Active Problem List   Diagnosis Date Noted  . Generalized anxiety disorder 11/11/2020  . GERD (gastroesophageal reflux disease) 11/11/2020  . Liposarcoma of thigh, left (HCC)Resection  11/11/2020    Scot Jun, PT, DPT, OCS,  ATC 01/23/21  4:39 PM    [facility] [address] [place], [place], [address] Phone: [phone]   Fax:  [phone]  Name: Anwyn Kriegel MRN: [id number] Date of Birth: 05-23-1976

## 2021-01-27 ENCOUNTER — Encounter: Payer: Self-pay | Admitting: Rehabilitative and Restorative Service Providers"

## 2021-01-27 ENCOUNTER — Ambulatory Visit (INDEPENDENT_AMBULATORY_CARE_PROVIDER_SITE_OTHER): Payer: 59 | Admitting: Rehabilitative and Restorative Service Providers"

## 2021-01-27 ENCOUNTER — Other Ambulatory Visit: Payer: Self-pay

## 2021-01-27 DIAGNOSIS — R29898 Other symptoms and signs involving the musculoskeletal system: Secondary | ICD-10-CM | POA: Diagnosis not present

## 2021-01-27 DIAGNOSIS — G8929 Other chronic pain: Secondary | ICD-10-CM | POA: Diagnosis not present

## 2021-01-27 DIAGNOSIS — M545 Low back pain, unspecified: Secondary | ICD-10-CM | POA: Diagnosis not present

## 2021-01-27 NOTE — Therapy (Signed)
[facility] [address] [place], [place], [address] Phone: [phone]   Fax:  [phone]  Physical Therapy Treatment  Patient Details  Name: Michelle Steele MRN: [id number] Date of Birth: Jul 24, 1976 Referring Provider (PT): Mcarthur Rossetti, MD   Encounter Date: 01/27/2021   PT End of Session - 01/27/21 1055    Visit Number 4    Number of Visits 12    Date for PT Re-Evaluation 02/13/21    Authorization Type [facility]    PT Start Time 1056    PT Stop Time 1135    PT Time Calculation (min) 39 min    Activity Tolerance Patient tolerated treatment well    Behavior During Therapy [facility] for tasks assessed/performed           Past Medical History:  Diagnosis Date  . Allergy   . Anxiety   . GERD (gastroesophageal reflux disease)     Past Surgical History:  Procedure Laterality Date  . resection of soft tissue tumor left thigh Left     There were no vitals filed for this visit.   Subjective Assessment - 01/27/21 1101    Subjective Pt. stated feeling "wonderful" after last visit.  Was sore but felt better.  Pt. stated feeling some on Rt side c working around house.    Pertinent History anxiety, recent removal of liposarcoma    Limitations Standing;Walking;Lifting    Diagnostic tests MRI ordered    Patient Stated Goals improve pain, clean house without significant pain    Pain Score 0-No pain    Pain Location Back    Pain Orientation Lower    Pain Onset More than a month ago                             [facility] Adult PT Treatment/Exercise - 01/27/21 0001      Lumbar Exercises: Stretches   Single Knee to Chest Stretch 5 reps;Left;Right   15 sec x 5 bilateral   Lower Trunk Rotation 5 reps   15 sec x 5 bilateral     Lumbar Exercises: Aerobic   Nustep Lvl 6 10 mins      Lumbar Exercises: Supine   Other Supine Lumbar Exercises supine dying bug arm/leg extension x 10 bilateral      Manual Therapy   Manual  therapy comments compression, palpation skilled to inferior Lt and Rt lumbar paraspinals            Trigger Point Dry Needling - 01/27/21 0001    Consent Given? Yes    Education Handout Provided Yes    Dry Needling Comments Lt /Rt lumbar inferior paraspinals c twitch response                  PT Short Term Goals - 01/23/21 1630      PT SHORT TERM GOAL #1   Title independent with initial HEP    Time 3    Period Weeks    Status Achieved    Target Date 01/23/21             PT Long Term Goals - 01/23/21 1630      PT LONG TERM GOAL #1   Title independent with final HEP    Time 6    Period Weeks    Status On-going    Target Date 02/13/21      PT LONG TERM GOAL #2   Title FOTO score  improved to 71 for improved function    Time 6    Period Weeks    Status On-going    Target Date 02/13/21      PT LONG TERM GOAL #3   Title perform lumbar flexion without increase in pain for improved function    Time 6    Period Weeks    Status Achieved      PT LONG TERM GOAL #4   Title report pain < 4/10 with household activiites for improved function    Time 6    Period Weeks    Status On-going    Target Date 02/13/21                 Plan - 01/27/21 1121    Clinical Impression Statement Pt. indicated marked improvement in Lt side following last visit treatment.  Repeated today for Lt and Rt side to help continue progression.  Will continue to improve core stability through HEP plan.    Personal Factors and Comorbidities Comorbidity 2;Time since onset of injury/illness/exacerbation    Comorbidities anxiety, recent removal of liposarcoma    Examination-Activity Limitations Bed Mobility;Sleep;Bend;Squat;Stairs;Stand;Lift;Transfers    Examination-Participation Restrictions Community Activity;Shop;Occupation    Stability/Clinical Decision Making Evolving/Moderate complexity    PT Frequency 2x / week   1-2x/wk   PT Duration 6 weeks    PT Treatment/Interventions  ADLs/Self Care Home Management;Cryotherapy;Moist Heat;Therapeutic exercise;Therapeutic activities;Functional mobility training;Ultrasound;Patient/family education;Manual techniques;Taping;Dry needling;Passive range of motion;Neuromuscular re-education    PT Next Visit Plan DN/ manual/modalities PRN (no estim due to recent sarcoma), core strengthning    PT Home Exercise Plan Access Code: [email or web address]    Consulted and Agree with Plan of Care Patient           Patient will benefit from skilled therapeutic intervention in order to improve the following deficits and impairments:  Pain,Decreased strength,Impaired flexibility,Decreased mobility,Increased fascial restricitons,Increased muscle spasms  Visit Diagnosis: Chronic bilateral low back pain without sciatica  Other symptoms and signs involving the musculoskeletal system     Problem List Patient Active Problem List   Diagnosis Date Noted  . Generalized anxiety disorder 11/11/2020  . GERD (gastroesophageal reflux disease) 11/11/2020  . Liposarcoma of thigh, left (HCC)Resection  11/11/2020    Scot Jun, PT, DPT, OCS, ATC 01/27/21  11:24 AM    [facility] [address] [place], [place], [address] Phone: [phone]   Fax:  [phone]  Name: Michelle Steele MRN: [id number] Date of Birth: February 27, 1976

## 2021-01-28 ENCOUNTER — Ambulatory Visit: Payer: 59 | Admitting: Orthopaedic Surgery

## 2021-01-30 ENCOUNTER — Encounter: Payer: 59 | Admitting: Rehabilitative and Restorative Service Providers"

## 2021-02-06 ENCOUNTER — Encounter: Payer: Self-pay | Admitting: Rehabilitative and Restorative Service Providers"

## 2021-02-06 ENCOUNTER — Encounter: Payer: 59 | Admitting: Rehabilitative and Restorative Service Providers"

## 2021-02-06 ENCOUNTER — Ambulatory Visit (INDEPENDENT_AMBULATORY_CARE_PROVIDER_SITE_OTHER): Payer: 59 | Admitting: Rehabilitative and Restorative Service Providers"

## 2021-02-06 ENCOUNTER — Other Ambulatory Visit: Payer: Self-pay

## 2021-02-06 ENCOUNTER — Telehealth: Payer: Self-pay | Admitting: Physical Medicine and Rehabilitation

## 2021-02-06 DIAGNOSIS — G8929 Other chronic pain: Secondary | ICD-10-CM

## 2021-02-06 DIAGNOSIS — M545 Low back pain, unspecified: Secondary | ICD-10-CM | POA: Diagnosis not present

## 2021-02-06 DIAGNOSIS — R29898 Other symptoms and signs involving the musculoskeletal system: Secondary | ICD-10-CM

## 2021-02-06 NOTE — Therapy (Addendum)
[facility] [address] [place], [place], [address] Phone: [phone]   Fax:  [phone]  Physical Therapy Treatment/Progress Note/Discharge  Patient Details  Name: Michelle Steele MRN: [id number] Date of Birth: September 08, 1975 Referring Provider (PT): Mcarthur Rossetti, MD   Encounter Date: 02/06/2021  Progress Note Reporting Period 01/02/2021 to 02/06/2021  See note below for Objective Data and Assessment of Progress/Goals.        PT End of Session - 02/06/21 1100     Visit Number 5    Number of Visits 12    Date for PT Re-Evaluation 02/13/21    Authorization Type [facility]    PT Start Time 1100    Activity Tolerance Patient tolerated treatment well    Behavior During Therapy WFL for tasks assessed/performed             Past Medical History:  Diagnosis Date   Allergy    Anxiety    GERD (gastroesophageal reflux disease)     Past Surgical History:  Procedure Laterality Date   resection of soft tissue tumor left thigh Left     There were no vitals filed for this visit.       [facility] PT Assessment - 02/06/21 0001       Assessment   Medical Diagnosis M54.50,G89.29 (ICD-10-CM) - Chronic bilateral low back pain, unspecified whether sciatica present    Referring Provider (PT) Mcarthur Rossetti, MD      Observation/Other Assessments   Focus on Therapeutic Outcomes (FOTO)  update 94%      AROM   Lumbar Flexion movement to ankles, no pain    Lumbar Extension 100% no complaints      Strength   Overall Strength Comments lumbar flexion 5/5                           [facility] Adult PT Treatment/Exercise - 02/06/21 0001       Self-Care   Self-Care Other Self-Care Comments    Other Self-Care Comments  Education and review of return to clinic plan based off symptoms, HEP use and plan for continued self care of presentation.                      PT Short Term Goals - 01/23/21 1630        PT SHORT TERM GOAL #1   Title independent with initial HEP    Time 3    Period Weeks    Status Achieved    Target Date 01/23/21               PT Long Term Goals - 02/06/21 1118       PT LONG TERM GOAL #1   Title independent with final HEP    Time 6    Period Weeks    Status Achieved      PT LONG TERM GOAL #2   Title FOTO score improved to 71 for improved function    Time 6    Period Weeks    Status Achieved      PT LONG TERM GOAL #3   Title perform lumbar flexion without increase in pain for improved function    Time 6    Period Weeks    Status Achieved      PT LONG TERM GOAL #4   Title report pain < 4/10 with household activiites for improved function    Time 6  Period Weeks    Status Achieved                   Plan - 02/06/21 1117     Clinical Impression Statement Pt. has reported no complaints of pain since last visit and return to usual activity without limitation.  Plan at this time to hold PT for 30 days and d/c if no return required for any return of symptoms.    Personal Factors and Comorbidities Comorbidity 2;Time since onset of injury/illness/exacerbation    Comorbidities anxiety, recent removal of liposarcoma    Examination-Activity Limitations Bed Mobility;Sleep;Bend;Squat;Stairs;Stand;Lift;Transfers    Examination-Participation Restrictions Community Activity;Shop;Occupation    Stability/Clinical Decision Making Evolving/Moderate complexity    PT Frequency 2x / week   1-2x/wk   PT Duration 6 weeks    PT Treatment/Interventions ADLs/Self Care Home Management;Cryotherapy;Moist Heat;Therapeutic exercise;Therapeutic activities;Functional mobility training;Ultrasound;Patient/family education;Manual techniques;Taping;Dry needling;Passive range of motion;Neuromuscular re-education    PT Next Visit Plan Trial HEP time for 30 days, d/c if no return at that point.    PT Home Exercise Plan Access Code: [email or web address]    Consulted and Agree with Plan of  Care Patient             Patient will benefit from skilled therapeutic intervention in order to improve the following deficits and impairments:  Pain,Decreased strength,Impaired flexibility,Decreased mobility,Increased fascial restricitons,Increased muscle spasms  Visit Diagnosis: Chronic bilateral low back pain without sciatica  Other symptoms and signs involving the musculoskeletal system     Problem List Patient Active Problem List   Diagnosis Date Noted   Generalized anxiety disorder 11/11/2020   GERD (gastroesophageal reflux disease) 11/11/2020   Liposarcoma of thigh, left (HCC)Resection  11/11/2020    Scot Jun, PT, DPT, OCS, ATC 02/06/21  11:19 AM   PHYSICAL THERAPY DISCHARGE SUMMARY  Visits from Start of Care: 5  Current functional level related to goals / functional outcomes: See note   Remaining deficits: See note   Education / Equipment: HEP   Patient agrees to discharge. Patient goals were partially met. Patient is being discharged due to being pleased with the current functional level.  Scot Jun, PT, DPT, OCS, ATC 04/16/21  2:43 PM     [facility] [address] [place], [place], [address] Phone: [phone]   Fax:  [phone]  Name: Michelle Steele MRN: [id number] Date of Birth: 1975-11-10

## 2021-02-06 NOTE — Telephone Encounter (Signed)
Called pt and LVM #1 

## 2021-02-06 NOTE — Telephone Encounter (Signed)
Pt is here in office and asked for her appt with Dr.Newton to be cancelled. She states she is feeling much better now.

## 2021-02-07 NOTE — Telephone Encounter (Signed)
Appointment cancelled

## 2021-02-13 ENCOUNTER — Encounter: Payer: 59 | Admitting: Rehabilitative and Restorative Service Providers"

## 2021-02-17 ENCOUNTER — Ambulatory Visit: Payer: 59 | Admitting: Physical Medicine and Rehabilitation

## 2021-02-27 ENCOUNTER — Telehealth: Payer: Self-pay | Admitting: Physical Medicine and Rehabilitation

## 2021-02-27 NOTE — Telephone Encounter (Signed)
Pt called returning a missed call.  [phone]

## 2021-03-03 ENCOUNTER — Ambulatory Visit (HOSPITAL_COMMUNITY)
Admission: EM | Admit: 2021-03-03 | Discharge: 2021-03-03 | Disposition: A | Discharge status: Home / Self Care | Payer: 59

## 2021-03-03 ENCOUNTER — Other Ambulatory Visit (HOSPITAL_COMMUNITY): Payer: Self-pay

## 2021-03-03 ENCOUNTER — Encounter (HOSPITAL_COMMUNITY): Payer: Self-pay

## 2021-03-03 DIAGNOSIS — L237 Allergic contact dermatitis due to plants, except food: Secondary | ICD-10-CM

## 2021-03-03 MED ORDER — PREDNISONE 10 MG PO TABS
ORAL_TABLET | ORAL | 0 refills | Status: DC
  Filled 2021-03-03: qty 42, 12d supply, fill #0

## 2021-03-03 MED ORDER — PREDNISONE 10 MG PO TABS: ORAL_TABLET | ORAL | 0 refills | Status: DC

## 2021-03-03 NOTE — ED Triage Notes (Signed)
Pt presents with itchy rash in neck and face x 5 day. Pt reports is form poison ivy.

## 2021-03-03 NOTE — ED Provider Notes (Signed)
[facility]    CSN: [id number] Arrival date & time: 03/03/21  1025      History   Chief Complaint Chief Complaint  Patient presents with   Poison Ivy    HPI Michelle Steele is a 45 y.o. female.   Patient presenting today with 5-day history of progressively worsening itchy rash in a linear pattern of right side of neck and extending to face, left forehead, now also behind right ear.  Denies any pain, numbness, tingling, headache, injury to the area.  Has been outdoors recently near poison ivy.  So far using hydrocortisone cream and alcohol with minimal relief.   Past Medical History:  Diagnosis Date   Allergy    Anxiety    GERD (gastroesophageal reflux disease)     Patient Active Problem List   Diagnosis Date Noted   Generalized anxiety disorder 11/11/2020   GERD (gastroesophageal reflux disease) 11/11/2020   Liposarcoma of thigh, left (HCC)Resection  11/11/2020    Past Surgical History:  Procedure Laterality Date   resection of soft tissue tumor left thigh Left     OB History   No obstetric history on file.      Home Medications    Prior to Admission medications   Medication Sig Start Date End Date Taking? Authorizing Provider  norethindrone-ethinyl estradiol-FE (LOESTRIN FE) 1-20 MG-MCG tablet Take 1 tablet by mouth daily.   Yes [provider]  predniSONE (DELTASONE) 10 MG tablet Take 6 tabs daily x 2 days, 5 tabs daily x 2 days, 4 tabs daily x 2 days, etc 03/03/21  Yes Volney American, PA-C  diclofenac (VOLTAREN) 75 MG EC tablet Take 1 tablet (75 mg total) by mouth 2 (two) times daily. Patient not taking: No sig reported 01/27/17   Wallene Huh, DPM  fexofenadine (ALLEGRA) 60 MG tablet Take 60 mg by mouth 2 (two) times daily.    [provider]  Misc Natural Products (TART CHERRY ADVANCED PO) Take by mouth. Patient not taking: No sig reported    [provider]  montelukast (SINGULAIR) 10 MG tablet Take 10 mg by  mouth at bedtime.    [provider]  Norethin Ace-Eth Estrad-FE 1-20 MG-MCG(24) CHEW Minastrin 24 Fe Patient not taking: No sig reported    [provider]  predniSONE (DELTASONE) 10 MG tablet 12 day tapering dose Patient not taking: No sig reported 01/06/17   Wallene Huh, DPM  ranitidine (ZANTAC) 150 MG tablet Take 150 mg by mouth 2 (two) times daily.    [provider]  TURMERIC PO Take by mouth. Patient not taking: No sig reported    [provider]  vortioxetine HBr (TRINTELLIX) 5 MG TABS tablet Take by mouth.    [provider]    Family History Family History  Problem Relation Age of Onset   Hypertension Mother    Stroke Mother    Hypertension Father     Social History Social History   Tobacco Use   Smoking status: Unknown   Smokeless tobacco: Never  Vaping Use   Vaping Use: Never used  Substance Use Topics   Alcohol use: No   Drug use: No     Allergies   Codeine, Hydrocodone-acetaminophen, and Proton pump inhibitors   Review of Systems Review of Systems Per HPI  Physical Exam Triage Vital Signs ED Triage Vitals  Enc Vitals Group     BP 03/03/21 1131 120/76     Pulse Rate 03/03/21 1131 70  Resp 03/03/21 1131 18     Temp 03/03/21 1131 98.6 F (37 C)     Temp Source 03/03/21 1131 Oral     SpO2 03/03/21 1131 100 %     Weight --      Height --      Head Circumference --      Peak Flow --      Pain Score 03/03/21 1128 8     Pain Loc --      Pain Edu? --      Excl. in [place]? --    No data found.  Updated Vital Signs BP 120/76 (BP Location: Right Arm)   Pulse 70   Temp 98.6 F (37 C) (Oral)   Resp 18   LMP  (Approximate)   SpO2 100%   Visual Acuity Right Eye Distance:   Left Eye Distance:   Bilateral Distance:    Right Eye Near:   Left Eye Near:    Bilateral Near:     Physical Exam Vitals and nursing note reviewed.  Constitutional:      Appearance: Normal appearance. She is not  ill-appearing.  HENT:     Head: Atraumatic.  Eyes:     Extraocular Movements: Extraocular movements intact.     Conjunctiva/sclera: Conjunctivae normal.  Cardiovascular:     Rate and Rhythm: Normal rate and regular rhythm.     Heart sounds: Normal heart sounds.  Pulmonary:     Effort: Pulmonary effort is normal.     Breath sounds: Normal breath sounds.  Musculoskeletal:        General: Normal range of motion.     Cervical back: Normal range of motion and neck supple.  Skin:    General: Skin is warm and dry.     Findings: Rash present.     Comments: Erythematous maculopapular rash with some vesicles present in a linear fashion up right-sided neck on the face, small patch on left forehead and behind right ear  Neurological:     Mental Status: She is alert and oriented to person, place, and time.  Psychiatric:        Mood and Affect: Mood normal.        Thought Content: Thought content normal.        Judgment: Judgment normal.   UC Treatments / Results  Labs (all labs ordered are listed, but only abnormal results are displayed) Labs Reviewed - No data to display  EKG  Radiology No results found.  Procedures Procedures (including critical care time)  Medications Ordered in UC Medications - No data to display  Initial Impression / Assessment and Plan / UC Course  I have reviewed the triage vital signs and the nursing notes.  Pertinent labs & imaging results that were available during my care of the patient were reviewed by me and considered in my medical decision making (see chart for details).     Given location and continued spread, will treat with extended prednisone taper, hydrocortisone cream.  Return for acutely worsening symptoms  Final Clinical Impressions(s) / UC Diagnoses   Final diagnoses:  Poison ivy dermatitis   Discharge Instructions   None    ED Prescriptions     Medication Sig Dispense Auth. Provider   predniSONE (DELTASONE) 10 MG tablet Take  6 tabs daily x 2 days, 5 tabs daily x 2 days, 4 tabs daily x 2 days, etc 42 tablet Volney American, [place]      PDMP not reviewed this encounter.  Volney American, [place] 03/03/21 1226

## 2021-03-03 NOTE — Telephone Encounter (Signed)
Called pt and she informed me she didn't want the inj, closed referral.

## 2021-03-25 DIAGNOSIS — C499 Malignant neoplasm of connective and soft tissue, unspecified: Secondary | ICD-10-CM | POA: Diagnosis not present

## 2021-03-25 DIAGNOSIS — Z888 Allergy status to other drugs, medicaments and biological substances status: Secondary | ICD-10-CM | POA: Diagnosis not present

## 2021-03-25 DIAGNOSIS — C4922 Malignant neoplasm of connective and soft tissue of left lower limb, including hip: Secondary | ICD-10-CM | POA: Diagnosis not present

## 2021-03-25 DIAGNOSIS — Z885 Allergy status to narcotic agent status: Secondary | ICD-10-CM | POA: Diagnosis not present

## 2021-03-25 DIAGNOSIS — Z483 Aftercare following surgery for neoplasm: Secondary | ICD-10-CM | POA: Diagnosis not present

## 2021-05-01 ENCOUNTER — Other Ambulatory Visit (HOSPITAL_COMMUNITY): Payer: Self-pay

## 2021-05-01 MED ORDER — MONTELUKAST SODIUM 10 MG PO TABS
ORAL_TABLET | ORAL | 4 refills | Status: DC
  Filled 2021-05-01: qty 90, 90d supply, fill #0

## 2021-05-01 MED ORDER — TARINA 24 FE 1-20 MG-MCG(24) PO TABS
ORAL_TABLET | ORAL | 5 refills | Status: DC
Start: 2020-08-21 — End: 2021-07-18
  Filled 2021-05-01: qty 84, 84d supply, fill #0
  Filled 2021-07-18: qty 84, 84d supply, fill #1

## 2021-05-05 ENCOUNTER — Other Ambulatory Visit (HOSPITAL_COMMUNITY): Payer: Self-pay

## 2021-05-09 ENCOUNTER — Ambulatory Visit (INDEPENDENT_AMBULATORY_CARE_PROVIDER_SITE_OTHER): Payer: 59 | Admitting: Podiatry

## 2021-05-09 ENCOUNTER — Encounter: Payer: Self-pay | Admitting: Podiatry

## 2021-05-09 ENCOUNTER — Ambulatory Visit (INDEPENDENT_AMBULATORY_CARE_PROVIDER_SITE_OTHER): Payer: 59

## 2021-05-09 ENCOUNTER — Other Ambulatory Visit: Payer: Self-pay

## 2021-05-09 DIAGNOSIS — B351 Tinea unguium: Secondary | ICD-10-CM | POA: Diagnosis not present

## 2021-05-09 DIAGNOSIS — M21619 Bunion of unspecified foot: Secondary | ICD-10-CM

## 2021-05-09 DIAGNOSIS — M21611 Bunion of right foot: Secondary | ICD-10-CM | POA: Diagnosis not present

## 2021-05-09 DIAGNOSIS — M21612 Bunion of left foot: Secondary | ICD-10-CM

## 2021-05-12 NOTE — Progress Notes (Signed)
Subjective:   Patient ID: Michelle Steele, female   DOB: 45 y.o.   MRN: [id number]   HPI Patient states she injured her right foot approximately 3 weeks ago and that she fell and hit her foot her shin and was bleeding.  States she is developed swelling about the big toe joint and she is worried because she thinks the bunion has gotten worse and she may have torn a ligament or tendon.  Patient does not smoke likes to be active   Review of Systems  All other systems reviewed and are negative.      Objective:  Physical Exam Vitals and nursing note reviewed.  Constitutional:      Appearance: She is well-developed.  Pulmonary:     Effort: Pulmonary effort is normal.  Musculoskeletal:        General: Normal range of motion.  Skin:    General: Skin is warm.  Neurological:     Mental Status: She is alert.    Neurovascular status intact muscle strength found to be adequate range of motion was adequate patient found to have inflammation fluid around the first MPJ right foot that is painful and also has had surgery for a sarcoma of her thigh left that she still is recovering from.  She has good range of motion and some swelling around the joint with mild movement of the big toe     Assessment:  Probability for injury to the right first MPJ right with swelling of the joint surface with also patient noted to have mild discoloration of several nailbeds     Plan:  H&P x-ray right reviewed and I went ahead and recommended ice therapy wider shoe gear and anti-inflammatories.  Patient will be seen back to recheck as needed and I will start topical medicines for the probable fungus which is also probably partial trauma to her nailbeds  X-rays indicate mild bunion deformity right with swelling around the first MPJ but no fracture or other indications of pathology when compared to the left foot

## 2021-06-04 ENCOUNTER — Other Ambulatory Visit (HOSPITAL_COMMUNITY): Payer: Self-pay

## 2021-06-04 MED ORDER — VORTIOXETINE HBR 10 MG PO TABS
10.0000 mg | ORAL_TABLET | Freq: Every day | ORAL | 0 refills | Status: DC
  Filled 2021-06-04: qty 60, 60d supply, fill #0

## 2021-06-05 ENCOUNTER — Other Ambulatory Visit (HOSPITAL_COMMUNITY): Payer: Self-pay

## 2021-07-18 ENCOUNTER — Other Ambulatory Visit (HOSPITAL_COMMUNITY): Payer: Self-pay

## 2021-07-18 MED ORDER — NORETHIN ACE-ETH ESTRAD-FE 1-20 MG-MCG(24) PO TABS
ORAL_TABLET | ORAL | 2 refills | Status: DC
  Filled 2021-07-18: qty 84, 84d supply, fill #0

## 2021-07-29 DIAGNOSIS — Z483 Aftercare following surgery for neoplasm: Secondary | ICD-10-CM | POA: Diagnosis not present

## 2021-07-29 DIAGNOSIS — C4922 Malignant neoplasm of connective and soft tissue of left lower limb, including hip: Secondary | ICD-10-CM | POA: Diagnosis not present

## 2021-07-29 DIAGNOSIS — Z9889 Other specified postprocedural states: Secondary | ICD-10-CM | POA: Diagnosis not present

## 2021-07-29 DIAGNOSIS — R936 Abnormal findings on diagnostic imaging of limbs: Secondary | ICD-10-CM | POA: Diagnosis not present

## 2021-07-29 DIAGNOSIS — C499 Malignant neoplasm of connective and soft tissue, unspecified: Secondary | ICD-10-CM | POA: Diagnosis not present

## 2021-08-13 ENCOUNTER — Other Ambulatory Visit (HOSPITAL_COMMUNITY): Payer: Self-pay

## 2021-08-15 ENCOUNTER — Other Ambulatory Visit (HOSPITAL_COMMUNITY): Payer: Self-pay

## 2021-08-16 ENCOUNTER — Other Ambulatory Visit (HOSPITAL_COMMUNITY): Payer: Self-pay

## 2021-08-20 ENCOUNTER — Other Ambulatory Visit (HOSPITAL_COMMUNITY): Payer: Self-pay

## 2021-08-22 ENCOUNTER — Other Ambulatory Visit (HOSPITAL_COMMUNITY): Payer: Self-pay

## 2021-08-22 DIAGNOSIS — D2261 Melanocytic nevi of right upper limb, including shoulder: Secondary | ICD-10-CM | POA: Diagnosis not present

## 2021-08-22 DIAGNOSIS — D485 Neoplasm of uncertain behavior of skin: Secondary | ICD-10-CM | POA: Diagnosis not present

## 2021-08-22 DIAGNOSIS — D225 Melanocytic nevi of trunk: Secondary | ICD-10-CM | POA: Diagnosis not present

## 2021-08-22 DIAGNOSIS — L814 Other melanin hyperpigmentation: Secondary | ICD-10-CM | POA: Diagnosis not present

## 2021-08-22 DIAGNOSIS — D2262 Melanocytic nevi of left upper limb, including shoulder: Secondary | ICD-10-CM | POA: Diagnosis not present

## 2021-08-22 DIAGNOSIS — L821 Other seborrheic keratosis: Secondary | ICD-10-CM | POA: Diagnosis not present

## 2021-08-25 ENCOUNTER — Other Ambulatory Visit (HOSPITAL_COMMUNITY): Payer: Self-pay

## 2021-08-25 MED ORDER — VORTIOXETINE HBR 10 MG PO TABS
10.0000 mg | ORAL_TABLET | Freq: Every day | ORAL | 1 refills | Status: AC
  Filled 2021-08-25: qty 90, 90d supply, fill #0

## 2021-08-26 ENCOUNTER — Other Ambulatory Visit (HOSPITAL_COMMUNITY): Payer: Self-pay

## 2021-08-26 MED ORDER — TRINTELLIX 10 MG PO TABS
ORAL_TABLET | ORAL | 0 refills | Status: DC
  Filled 2021-08-26: qty 90, 90d supply, fill #0

## 2021-08-26 MED ORDER — MONTELUKAST SODIUM 10 MG PO TABS
10.0000 mg | ORAL_TABLET | Freq: Every day | ORAL | 0 refills | Status: DC
  Filled 2021-08-26: qty 90, 90d supply, fill #0

## 2021-08-26 MED ORDER — TRINTELLIX 10 MG PO TABS
ORAL_TABLET | ORAL | 0 refills | Status: DC
Start: 2021-08-26 — End: 2023-12-02
  Filled 2021-08-26: qty 90, 90d supply, fill #0

## 2021-08-28 ENCOUNTER — Other Ambulatory Visit (HOSPITAL_COMMUNITY): Payer: Self-pay

## 2021-09-05 ENCOUNTER — Other Ambulatory Visit (HOSPITAL_COMMUNITY): Payer: Self-pay

## 2021-09-16 ENCOUNTER — Other Ambulatory Visit (HOSPITAL_COMMUNITY): Payer: Self-pay

## 2021-09-16 DIAGNOSIS — D485 Neoplasm of uncertain behavior of skin: Secondary | ICD-10-CM | POA: Diagnosis not present

## 2021-09-16 DIAGNOSIS — L988 Other specified disorders of the skin and subcutaneous tissue: Secondary | ICD-10-CM | POA: Diagnosis not present

## 2021-09-16 MED ORDER — MUPIROCIN 2 % EX OINT
TOPICAL_OINTMENT | CUTANEOUS | 0 refills | Status: DC
  Filled 2021-09-16: qty 22, 7d supply, fill #0

## 2021-09-17 ENCOUNTER — Other Ambulatory Visit (HOSPITAL_COMMUNITY): Payer: Self-pay

## 2021-09-23 ENCOUNTER — Other Ambulatory Visit (HOSPITAL_COMMUNITY): Payer: Self-pay

## 2021-09-23 MED ORDER — BETAMETHASONE DIPROPIONATE AUG 0.05 % EX CREA
TOPICAL_CREAM | CUTANEOUS | 0 refills | Status: DC
  Filled 2021-09-23: qty 50, 30d supply, fill #0
  Filled 2021-09-23: qty 50, 25d supply, fill #0

## 2021-10-06 ENCOUNTER — Other Ambulatory Visit (HOSPITAL_COMMUNITY): Payer: Self-pay

## 2021-10-06 MED ORDER — NORETHIN ACE-ETH ESTRAD-FE 1-20 MG-MCG(24) PO TABS
1.0000 | ORAL_TABLET | Freq: Every day | ORAL | 4 refills | Status: DC
  Filled 2021-10-06: qty 84, 84d supply, fill #0
  Filled 2021-12-19: qty 84, 84d supply, fill #1

## 2021-10-07 ENCOUNTER — Other Ambulatory Visit (HOSPITAL_COMMUNITY): Payer: Self-pay

## 2021-10-21 ENCOUNTER — Other Ambulatory Visit (HOSPITAL_COMMUNITY): Payer: Self-pay

## 2021-10-21 DIAGNOSIS — E669 Obesity, unspecified: Secondary | ICD-10-CM | POA: Diagnosis not present

## 2021-10-21 DIAGNOSIS — Z719 Counseling, unspecified: Secondary | ICD-10-CM | POA: Diagnosis not present

## 2021-10-21 DIAGNOSIS — Z9229 Personal history of other drug therapy: Secondary | ICD-10-CM | POA: Diagnosis not present

## 2021-10-21 DIAGNOSIS — Z1231 Encounter for screening mammogram for malignant neoplasm of breast: Secondary | ICD-10-CM | POA: Diagnosis not present

## 2021-10-21 DIAGNOSIS — Z01419 Encounter for gynecological examination (general) (routine) without abnormal findings: Secondary | ICD-10-CM | POA: Diagnosis not present

## 2021-10-21 MED ORDER — BLISOVI 24 FE 1-20 MG-MCG(24) PO TABS
1.0000 | ORAL_TABLET | Freq: Every day | ORAL | 4 refills | Status: AC
  Filled 2021-10-21 – 2022-03-06 (×3): qty 84, 84d supply, fill #0

## 2021-12-04 ENCOUNTER — Other Ambulatory Visit (HOSPITAL_COMMUNITY): Payer: Self-pay

## 2021-12-04 DIAGNOSIS — F410 Panic disorder [episodic paroxysmal anxiety] without agoraphobia: Secondary | ICD-10-CM | POA: Diagnosis not present

## 2021-12-04 DIAGNOSIS — F329 Major depressive disorder, single episode, unspecified: Secondary | ICD-10-CM | POA: Diagnosis not present

## 2021-12-04 DIAGNOSIS — I10 Essential (primary) hypertension: Secondary | ICD-10-CM | POA: Diagnosis not present

## 2021-12-04 DIAGNOSIS — K219 Gastro-esophageal reflux disease without esophagitis: Secondary | ICD-10-CM | POA: Diagnosis not present

## 2021-12-04 DIAGNOSIS — K5900 Constipation, unspecified: Secondary | ICD-10-CM | POA: Diagnosis not present

## 2021-12-04 DIAGNOSIS — E669 Obesity, unspecified: Secondary | ICD-10-CM | POA: Diagnosis not present

## 2021-12-04 DIAGNOSIS — G43909 Migraine, unspecified, not intractable, without status migrainosus: Secondary | ICD-10-CM | POA: Diagnosis not present

## 2021-12-04 DIAGNOSIS — F419 Anxiety disorder, unspecified: Secondary | ICD-10-CM | POA: Diagnosis not present

## 2021-12-04 MED ORDER — TRINTELLIX 10 MG PO TABS
ORAL_TABLET | ORAL | 3 refills | Status: DC
  Filled 2021-12-04: qty 90, 90d supply, fill #0

## 2021-12-04 MED ORDER — MONTELUKAST SODIUM 10 MG PO TABS
10.0000 mg | ORAL_TABLET | Freq: Every day | ORAL | 3 refills | Status: DC
  Filled 2021-12-04: qty 90, 90d supply, fill #0
  Filled 2022-03-03: qty 90, 90d supply, fill #1

## 2021-12-19 ENCOUNTER — Other Ambulatory Visit (HOSPITAL_COMMUNITY): Payer: Self-pay

## 2021-12-20 ENCOUNTER — Other Ambulatory Visit (HOSPITAL_COMMUNITY): Payer: Self-pay

## 2021-12-23 ENCOUNTER — Other Ambulatory Visit (HOSPITAL_COMMUNITY): Payer: Self-pay

## 2021-12-23 DIAGNOSIS — Z713 Dietary counseling and surveillance: Secondary | ICD-10-CM | POA: Diagnosis not present

## 2021-12-23 DIAGNOSIS — Z7182 Exercise counseling: Secondary | ICD-10-CM | POA: Diagnosis not present

## 2021-12-23 DIAGNOSIS — E539 Vitamin B deficiency, unspecified: Secondary | ICD-10-CM | POA: Diagnosis not present

## 2021-12-23 DIAGNOSIS — E669 Obesity, unspecified: Secondary | ICD-10-CM | POA: Diagnosis not present

## 2021-12-23 DIAGNOSIS — Z6831 Body mass index (BMI) 31.0-31.9, adult: Secondary | ICD-10-CM | POA: Diagnosis not present

## 2021-12-23 DIAGNOSIS — E559 Vitamin D deficiency, unspecified: Secondary | ICD-10-CM | POA: Diagnosis not present

## 2021-12-23 MED ORDER — ONDANSETRON 4 MG PO TBDP
ORAL_TABLET | ORAL | 0 refills | Status: AC
  Filled 2021-12-23: qty 30, 10d supply, fill #0

## 2021-12-23 MED ORDER — WEGOVY 0.5 MG/0.5ML ~~LOC~~ SOAJ
SUBCUTANEOUS | 0 refills | Status: DC
  Filled 2021-12-23 – 2022-01-01 (×2): qty 2, 28d supply, fill #0

## 2021-12-24 ENCOUNTER — Other Ambulatory Visit (HOSPITAL_COMMUNITY): Payer: Self-pay

## 2021-12-29 ENCOUNTER — Other Ambulatory Visit (HOSPITAL_COMMUNITY): Payer: Self-pay

## 2021-12-30 ENCOUNTER — Other Ambulatory Visit (HOSPITAL_COMMUNITY): Payer: Self-pay

## 2022-01-01 ENCOUNTER — Other Ambulatory Visit (HOSPITAL_COMMUNITY): Payer: Self-pay

## 2022-01-05 ENCOUNTER — Other Ambulatory Visit (HOSPITAL_COMMUNITY): Payer: Self-pay

## 2022-01-05 MED ORDER — "SYRINGE 25G X 1"" 3 ML MISC"
0 refills | Status: DC
  Filled 2022-01-05: qty 3, 90d supply, fill #0

## 2022-01-05 MED ORDER — CYANOCOBALAMIN 1000 MCG/ML IJ SOLN
INTRAMUSCULAR | 0 refills | Status: DC
  Filled 2022-01-05: qty 3, 84d supply, fill #0

## 2022-02-10 DIAGNOSIS — Z483 Aftercare following surgery for neoplasm: Secondary | ICD-10-CM | POA: Diagnosis not present

## 2022-02-10 DIAGNOSIS — Z85831 Personal history of malignant neoplasm of soft tissue: Secondary | ICD-10-CM | POA: Diagnosis not present

## 2022-02-10 DIAGNOSIS — C499 Malignant neoplasm of connective and soft tissue, unspecified: Secondary | ICD-10-CM | POA: Diagnosis not present

## 2022-02-16 ENCOUNTER — Other Ambulatory Visit (HOSPITAL_COMMUNITY): Payer: Self-pay

## 2022-02-16 MED ORDER — WEGOVY 1 MG/0.5ML ~~LOC~~ SOAJ
SUBCUTANEOUS | 0 refills | Status: DC
  Filled 2022-02-16: qty 2, 28d supply, fill #0

## 2022-02-26 ENCOUNTER — Other Ambulatory Visit (HOSPITAL_COMMUNITY): Payer: Self-pay

## 2022-02-26 MED ORDER — WEGOVY 0.25 MG/0.5ML ~~LOC~~ SOAJ
SUBCUTANEOUS | 0 refills | Status: DC
  Filled 2022-02-26: qty 2, 28d supply, fill #0

## 2022-02-27 ENCOUNTER — Other Ambulatory Visit (HOSPITAL_COMMUNITY): Payer: Self-pay

## 2022-03-03 ENCOUNTER — Other Ambulatory Visit (HOSPITAL_COMMUNITY): Payer: Self-pay

## 2022-03-05 IMAGING — MR MR LUMBAR SPINE W/O CM
4 of 5 series · 27 of 48 positions shown · non-contrast
Comparison: Outside lumbar radiographs from December 16, 2020 without
report.

CLINICAL DATA: Low back pain with recent bilateral lower extremity
pain and numbness.

EXAM:
MRI LUMBAR SPINE WITHOUT CONTRAST
TECHNIQUE: Multiplanar, multisequence MR imaging of the lumbar spine was
performed. No intravenous contrast was administered.

[Series 3: T2 · sagittal · 4.0mm · 1.09mm/px · 6 of 15 slices shown (1 of 2)]
[im 1/15]
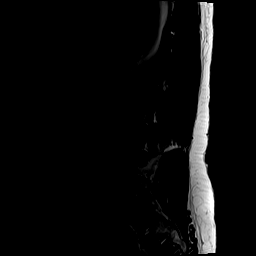
[im 3/15]
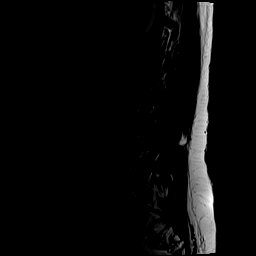
[im 6/15]
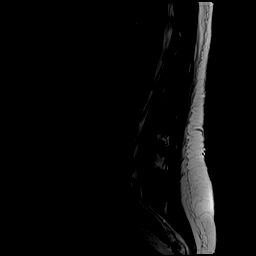
[im 9/15]
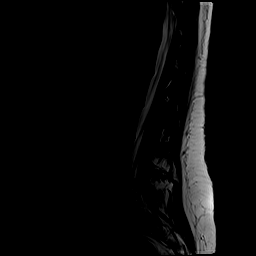
[im 12/15]
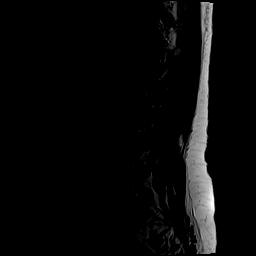
[im 15/15]
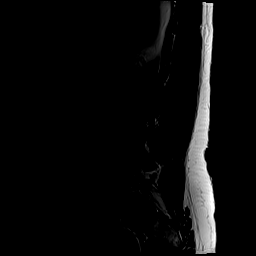

[Series 5: T1 · sagittal · 4.0mm · 1.09mm/px · 5 of 15 slices shown (1 of 2)]
[im 1/15]
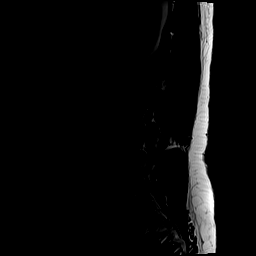
[im 4/15]
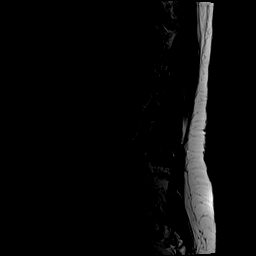
[im 8/15]
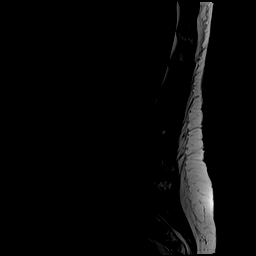
[im 11/15]
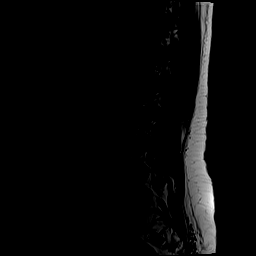
[im 15/15]
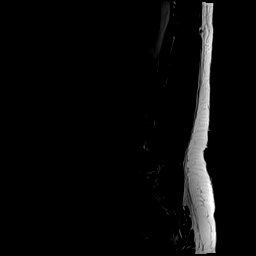

[Series 6: T2 · axial · 4.0mm · 0.39mm/px · z∈[-76,+145]mm · 10 of 43 slices shown (2 of 2)]
[im 3/43]
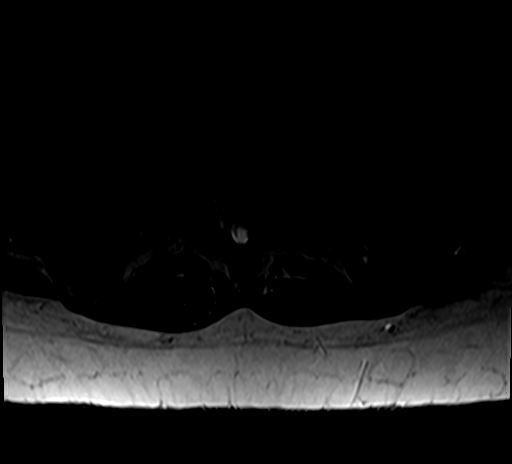
[im 6/43]
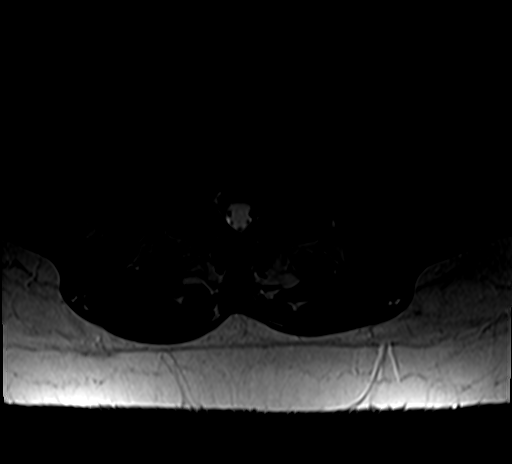
[im 9/43]
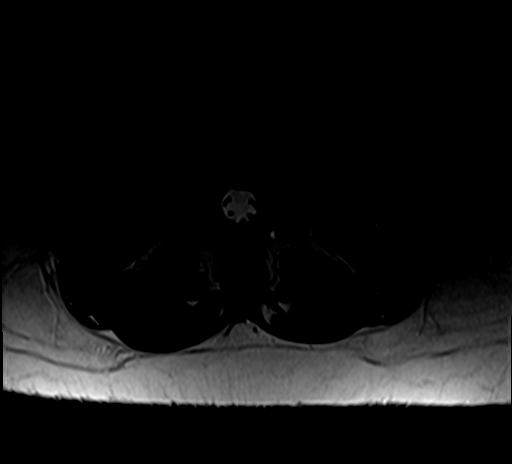
[im 15/43]
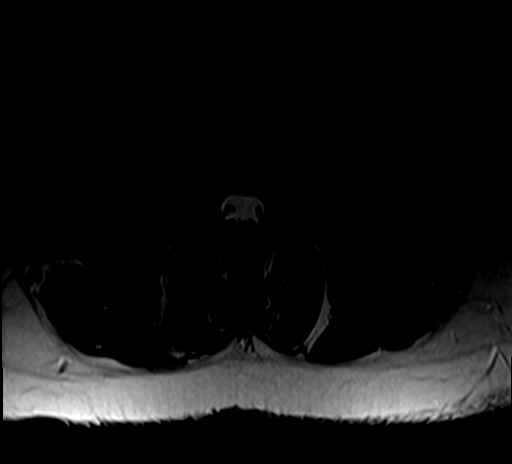
[im 20/43]
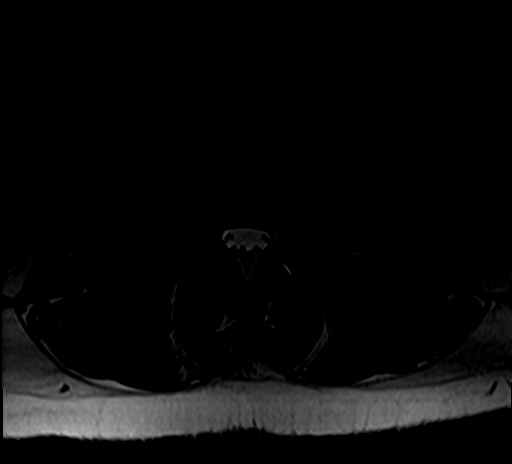
[im 23/43]
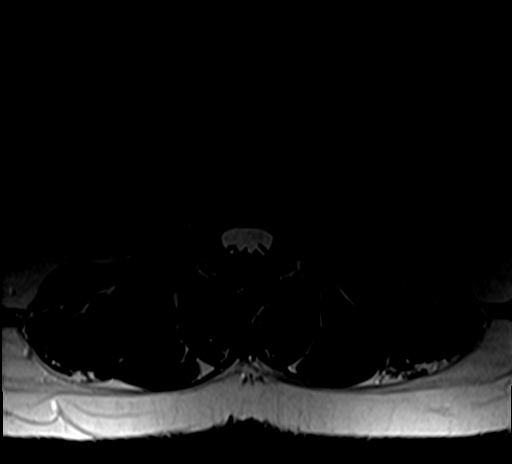
[im 26/43]
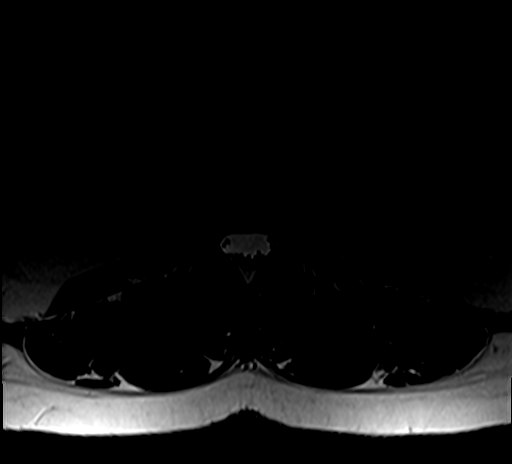
[im 31/43]
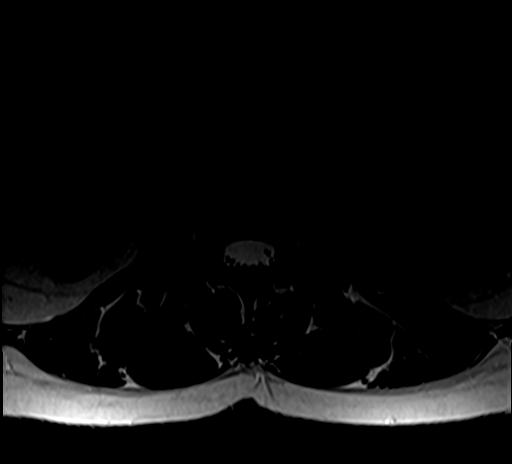
[im 37/43]
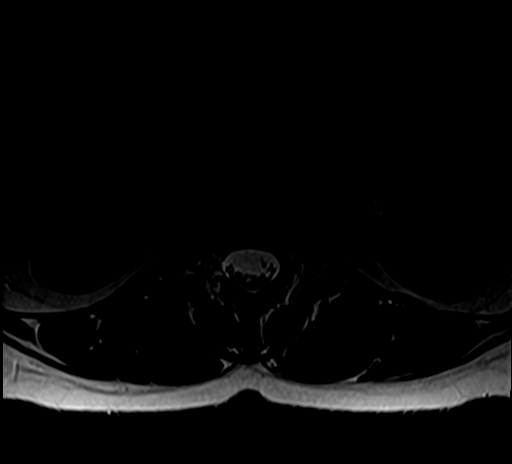
[im 43/43]
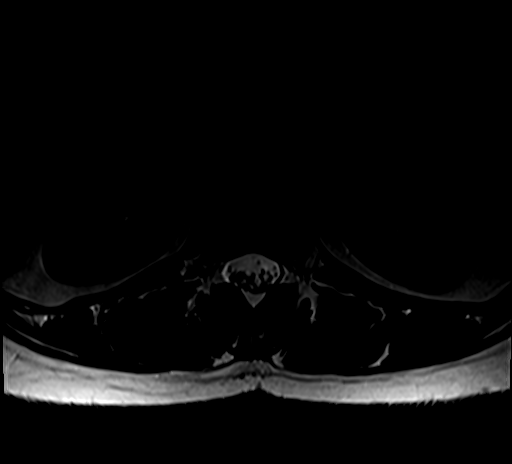

[Series 7: T1 · axial · 4.0mm · 0.39mm/px · z∈[-78,+119]mm · 6 of 43 slices shown (2 of 2)]
[im 3/43]
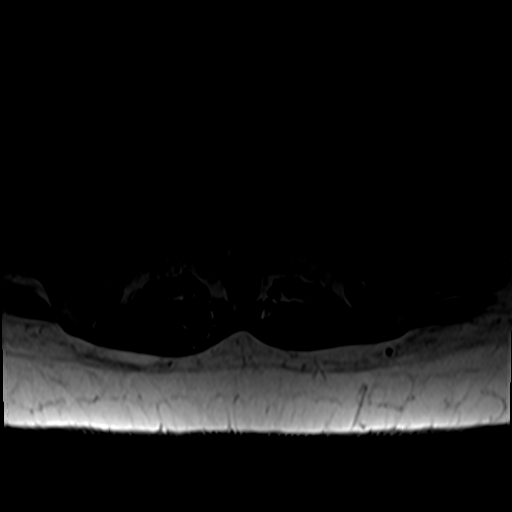
[im 6/43]
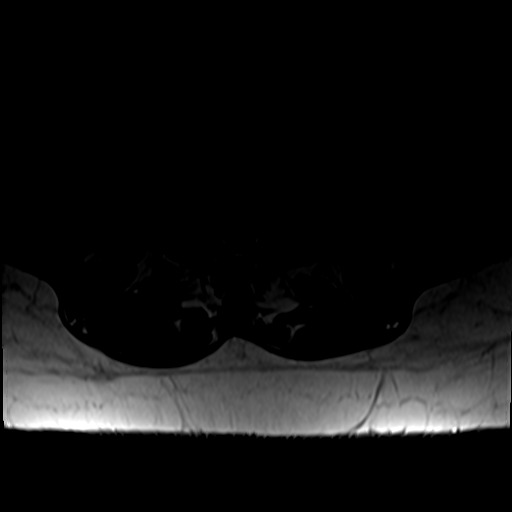
[im 9/43]
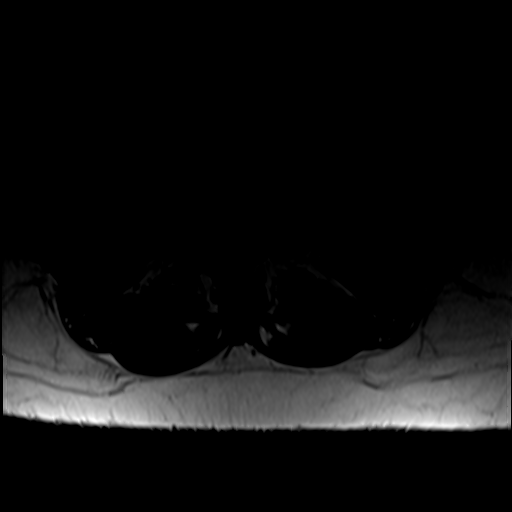
[im 15/43]
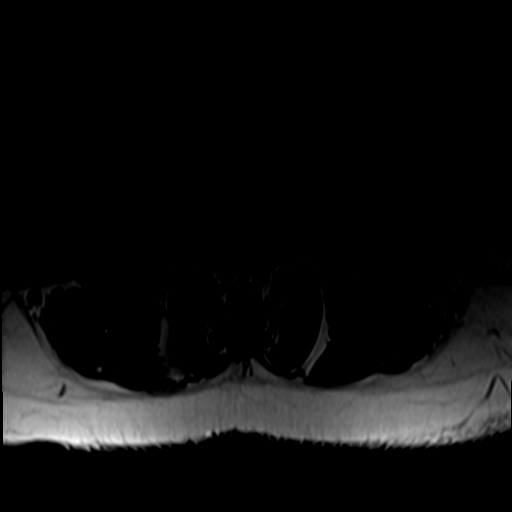
[im 23/43]
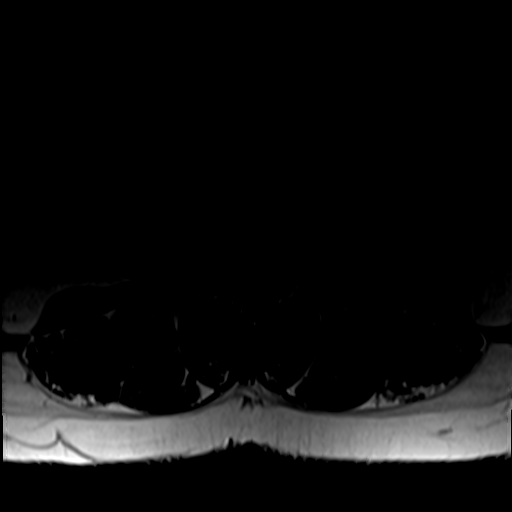
[im 37/43]
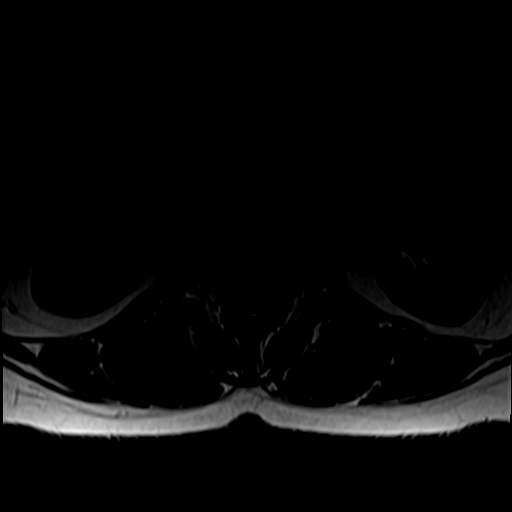

[27 of 48 positions shown; findings below may reference images not displayed]

FINDINGS: Segmentation: Standard segmentation is assumed. The inferior-most
fully formed intervertebral disc is labeled L5-S1.

Alignment: Slight (grade 1) retrolisthesis of L4 on L5. Otherwise,
no substantial sagittal subluxation.

Vertebrae: Degenerative/discogenic endplate signal changes about the
left eccentric L5-S1 disc. A specific evidence of acute fracture,
discitis/osteomyelitis, or suspicious bone lesion. Scattered T1
hyperintense benign vertebral venous malformations.

Conus medullaris and cauda equina: Conus extends to the T12-L1
level. Conus appears normal.

Paraspinal and other soft tissues: Partially imaged small T2
hypointense areas in the uterus, likely fibroids.

Disc levels:

T12-L1: No significant disc protrusion, foraminal stenosis, or canal
stenosis.

L1-L2: Small left paracentral disc protrusion without significant
canal or foraminal stenosis.

L2-L3: Small broad disc bulge with small superimposed central disc
protrusion. No significant canal or foraminal stenosis.

L3-L4: Small broad disc bulge and mild bilateral facet hypertrophy
without significant canal or foraminal stenosis.

L4-L5: Small broad disc bulge. Superimposed is small left far
lateral/extraforaminal disc protrusion (series 6, images 31 and 32),
which contacts the exiting/exited left L4 nerve without
displacement. No significant canal or foraminal stenosis. Mild left
subarticular recess stenosis.

L5-S1: Left eccentric degenerative disc height loss and desiccation
with reactive endplate marrow changes. Left eccentric disc bulge
with mild-to-moderate left foraminal stenosis. Left far
lateral/extraforaminal disc contacts the exiting/exited left L5
nerve.
IMPRESSION: 1. At L5-S1, left eccentric degenerative disc disease. Mild to
moderate left foraminal stenosis at this level with left far
lateral/extraforaminal disc contacting the exiting/exited left L5
nerve.
2. At L4-L5, left far lateral/extraforaminal disc protrusion
contacts the exiting/exited left L4 nerve without displacement or
foraminal stenosis.
3. No significant canal stenosis. Mild left subarticular recess
stenosis at L4-L5 and L5-S1.

## 2022-03-06 ENCOUNTER — Other Ambulatory Visit (HOSPITAL_COMMUNITY): Payer: Self-pay

## 2022-03-09 ENCOUNTER — Other Ambulatory Visit (HOSPITAL_COMMUNITY): Payer: Self-pay

## 2022-03-11 ENCOUNTER — Other Ambulatory Visit (HOSPITAL_COMMUNITY): Payer: Self-pay

## 2022-03-11 MED ORDER — WEGOVY 0.5 MG/0.5ML ~~LOC~~ SOAJ
SUBCUTANEOUS | 0 refills | Status: DC
  Filled 2022-03-11: qty 2, 28d supply, fill #0

## 2022-05-05 ENCOUNTER — Other Ambulatory Visit (HOSPITAL_COMMUNITY): Payer: Self-pay

## 2022-05-05 MED ORDER — WEGOVY 1 MG/0.5ML ~~LOC~~ SOAJ
SUBCUTANEOUS | 0 refills | Status: DC
  Filled 2022-05-05: qty 2, 28d supply, fill #0

## 2022-05-21 ENCOUNTER — Other Ambulatory Visit (HOSPITAL_COMMUNITY): Payer: Self-pay

## 2022-05-23 ENCOUNTER — Other Ambulatory Visit (HOSPITAL_COMMUNITY): Payer: Self-pay

## 2022-05-28 ENCOUNTER — Other Ambulatory Visit (HOSPITAL_COMMUNITY): Payer: Self-pay

## 2022-06-12 ENCOUNTER — Other Ambulatory Visit (HOSPITAL_COMMUNITY): Payer: Self-pay

## 2022-06-15 ENCOUNTER — Other Ambulatory Visit (HOSPITAL_COMMUNITY): Payer: Self-pay

## 2022-06-16 ENCOUNTER — Other Ambulatory Visit (HOSPITAL_COMMUNITY): Payer: Self-pay

## 2022-12-14 ENCOUNTER — Other Ambulatory Visit (HOSPITAL_COMMUNITY): Payer: Self-pay

## 2022-12-14 MED ORDER — MONTELUKAST SODIUM 10 MG PO TABS
10.0000 mg | ORAL_TABLET | Freq: Every day | ORAL | 3 refills | Status: DC
Start: 2022-12-14 — End: 2023-12-02
  Filled 2022-12-14: qty 30, 30d supply, fill #0

## 2022-12-22 ENCOUNTER — Other Ambulatory Visit (HOSPITAL_COMMUNITY): Payer: Self-pay

## 2023-12-02 ENCOUNTER — Emergency Department (HOSPITAL_COMMUNITY)

## 2023-12-02 ENCOUNTER — Encounter (HOSPITAL_COMMUNITY): Payer: Self-pay

## 2023-12-02 ENCOUNTER — Emergency Department (HOSPITAL_COMMUNITY): Admission: EM | Admit: 2023-12-02 | Discharge: 2023-12-02 | Disposition: A | Discharge status: Home / Self Care

## 2023-12-02 ENCOUNTER — Other Ambulatory Visit: Payer: Self-pay

## 2023-12-02 DIAGNOSIS — R2231 Localized swelling, mass and lump, right upper limb: Secondary | ICD-10-CM | POA: Diagnosis not present

## 2023-12-02 DIAGNOSIS — I808 Phlebitis and thrombophlebitis of other sites: Secondary | ICD-10-CM

## 2023-12-02 DIAGNOSIS — M79631 Pain in right forearm: Secondary | ICD-10-CM | POA: Diagnosis present

## 2023-12-02 HISTORY — DX: Malignant neoplasm of connective and soft tissue of left lower limb, including hip: C49.22

## 2023-12-02 HISTORY — DX: Malignant neoplasm of connective and soft tissue, unspecified: C49.9

## 2023-12-02 MED ORDER — ACETAMINOPHEN 325 MG PO TABS
650.0000 mg | ORAL_TABLET | Freq: Once | ORAL | Status: AC | PRN
  Administered 2023-12-02: 650 mg via ORAL

## 2023-12-02 MED ORDER — OXYCODONE HCL 5 MG PO TABS: 5.0000 mg | ORAL_TABLET | Freq: Four times a day (QID) | ORAL | 0 refills | Status: AC | PRN

## 2023-12-02 MED ORDER — ACETAMINOPHEN 325 MG PO TABS
ORAL_TABLET | ORAL | Status: AC
  Administered 2023-12-02: 650 mg via ORAL
  Filled 2023-12-02: qty 2

## 2023-12-02 MED ORDER — KETOROLAC TROMETHAMINE 15 MG/ML IJ SOLN
15.0000 mg | Freq: Once | INTRAMUSCULAR | Status: AC
  Administered 2023-12-02: 15 mg via INTRAMUSCULAR
  Filled 2023-12-02: qty 1

## 2023-12-02 NOTE — ED Provider Notes (Signed)
  Physical Exam  BP 124/83 (BP Location: Left Arm)   Pulse 88   Temp (!) 97.5 F (36.4 C) (Temporal)   Resp 16   Ht 5\' 7"  (1.702 m)   Wt 88 kg   SpO2 99%   BMI 30.39 kg/m   Physical Exam  Procedures  Procedures  ED Course / MDM   Clinical Course as of 12/02/23 2111  Thu Dec 02, 2023  1629 Assumed care. 48 yo F who presented with thromboplebitis of the arm. No DVT. Does have large clot. Dr Lenell Antu will call back about recommendations. Recent lipoma removal on 3/19.  [RP]    Clinical Course User Index [RP] Rondel Baton, MD   Medical Decision Making Risk OTC drugs. Prescription drug management.    Dr. Lenell Antu called back.  Recommended warm compresses, NSAIDs, and Tylenol.  Does not feel that anticoagulation is indicated at this time.  Will have the patient follow-up with him in 1 week.  Return precautions discussed prior to discharge.     Rondel Baton, MD 12/02/23 2111

## 2023-12-02 NOTE — Discharge Instructions (Addendum)
 You were seen for the blood clot in your arm (thrombophlebitis) in the emergency department.  We discussed this with the vascular surgeons.  Based on the location they would like for you to try warm presses, Tylenol, and aspirin for the pain. You may also take the oxycodone we have prescribed you for any breakthrough pain that may have.  Do not take this before driving or operating heavy machinery.  Do not take this medication with alcohol.  Check your MyChart online for the results of any tests that had not resulted by the time you left the emergency department.   Follow-up with vascular surgery in 1 week regarding your blood clot.  Return immediately to the emergency department if you experience any of the following: Chest pain, shortness of breath, numbness or weakness of your arm, or any other concerning symptoms.    Thank you for visiting our Emergency Department. It was a pleasure taking care of you today.

## 2023-12-02 NOTE — ED Triage Notes (Signed)
 Pt arrived via POV c/o distal right arm pain, swelling and redness that began after she had a procedure performed at [facility]. Pt reports she had an IV Access inserted just distal to the site that is now red, swollen and painful.

## 2023-12-02 NOTE — ED Provider Notes (Signed)
 [facility] AT [facility] Provider Note   CSN: [id number] Arrival date & time: 12/02/23  1225     History  Chief Complaint  Patient presents with   Arm Pain    Michelle Steele is a 48 y.o. female.  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  Patient states that this been going now for the past few days.  She reports that area where she had her IV started hurting and has gradually worsened since then.  She denies any chest pain, palpitations, or shortness of breath.   Arm Pain       Home Medications Prior to Admission medications   Medication Sig Start Date End Date Taking? Authorizing Provider  augmented betamethasone dipropionate (DIPROLENE-AF) 0.05 % cream Apply a small amount to skin twice a day 09/23/21     cyanocobalamin (,VITAMIN B-12,) 1000 MCG/ML injection Inject 1 ML into the skin once a month for 3 months (at least 4 weeks between injections) 01/05/22     montelukast (SINGULAIR) 10 MG tablet Take 1 tablet by mouth once daily 08/25/21     montelukast (SINGULAIR) 10 MG tablet Take 1 tablet by mouth daily 12/04/21     montelukast (SINGULAIR) 10 MG tablet Take 1 tablet by mouth daily 12/14/22     mupirocin ointment (BACTROBAN) 2 % Apply a small amount to skin daily 09/16/21     Norethindrone Acetate-Ethinyl Estrad-FE (LOESTRIN 24 FE) 1-20 MG-MCG(24) tablet Take 1 tablet by mouth daily. 10/06/21     Norethindrone Acetate-Ethinyl Estrad-FE (BLISOVI 24 FE) 1-20 MG-MCG(24) tablet Take 1 tablet by mouth daily 10/21/21     ondansetron (ZOFRAN-ODT) 4 MG disintegrating tablet Take 1 tablet by mouth every 8 hours if needed for nausea. 12/23/21     Semaglutide-Weight Management (WEGOVY) 0.25 MG/0.5ML SOAJ Inject 1 pen (0.25 mg) into the skin once weekly x 4 weeks then increase to 0.5 mg dosage 02/26/22     Semaglutide-Weight Management  (WEGOVY) 0.5 MG/0.5ML SOAJ Inject 0.5 mg into the skin once weekly x 4 weeks then increase to 1.0 mg dosage. 03/11/22     Semaglutide-Weight Management (WEGOVY) 1 MG/0.5ML SOAJ Inject 1.0 mg under the skin once weekly for 4 weeks then increase to 1.7 mg dosage. 05/05/22     Syringe/Needle, Disp, (SYRINGE 3CC/25GX1") 25G X 1" 3 ML MISC Use to inject B12 once monthly 01/05/22     vortioxetine HBr (TRINTELLIX) 10 MG TABS tablet Take 1 tablet (10 mg total) by mouth daily. 08/15/21     vortioxetine HBr (TRINTELLIX) 10 MG TABS tablet Take 1 tablet by mouth once daily. 08/25/21     vortioxetine HBr (TRINTELLIX) 10 MG TABS tablet Take 1 tablet by mouth once daily 08/26/21     vortioxetine HBr (TRINTELLIX) 10 MG TABS tablet Take 1 tablet by mouth daily 12/04/21         Allergies    Codeine, Hydrocodone-acetaminophen, and Proton pump inhibitors    Review of Systems   Review of Systems  Musculoskeletal:        Right arm pain  All other systems reviewed and are negative.   Physical Exam Updated Vital Signs BP 124/83 (BP Location: Left Arm)   Pulse 88   Temp (!) 97.5 F (36.4 C) (Temporal)   Resp 16   Ht 5\' 7"  (1.702 m)   Wt 88 kg  SpO2 99%   BMI 30.39 kg/m  Physical Exam Vitals and nursing note reviewed.   Gen: NAD Eyes: PERRL, EOMI HEENT: no oropharyngeal swelling Neck: trachea midline Resp: clear to auscultation bilaterally Card: RRR, no murmurs, rubs, or gallops Abd: nontender, nondistended Extremities: The patient does have some tenderness over the right arm with some mild overlying erythema over the forearm, no obvious palpable cords noted, compartments are soft Vascular: 2+ radial pulses bilaterally, 2+ DP pulses bilaterally Skin: no rashes Psyc: acting appropriately   ED Results / Procedures / Treatments   Labs (all labs ordered are listed, but only abnormal results are displayed) Labs Reviewed - No data to display  EKG None  Radiology No results  found.  Procedures Procedures    Medications Ordered in ED Medications - No data to display  ED Course/ Medical Decision Making/ A&P Clinical Course as of 12/06/23 1609  Thu Dec 02, 2023  1629 Assumed care. 48 yo F who presented with thromboplebitis of the arm. No DVT. Does have large clot. Dr Lenell Antu will call back about recommendations. Recent lipoma removal on 3/19.  [RP]    Clinical Course User Index [RP] Rondel Baton, MD                                 Medical Decision Making 48 year old female presents the emergency department today with pain and mild swelling of the right forearm.  I will further evaluate patient with an ultrasound to evaluate for DVT or superficial thrombophlebitis.  This may be due to cellulitis as well if her imaging studies are negative.  I will reevaluate after her ultrasound for ultimate disposition and treatment.  She is denying any symptoms consistent with pulmonary embolism at this time.  The patient does have a superficial thrombophlebitis.  Dr. Lenell Antu will call back after his case to discuss treatment further.  Pending at the time of signout.  Risk OTC drugs. Prescription drug management.           Final Clinical Impression(s) / ED Diagnoses Final diagnoses:  None    Rx / DC Orders ED Discharge Orders     None         Durwin Glaze, MD 12/06/23 1609
